# Patient Record
Sex: Female | Born: 1985 | Race: White | Hispanic: No | Marital: Single | Smoking: Current some day smoker
Health system: Southern US, Community
[De-identification: ages and names within clinical notes are randomized; demographics above are authoritative.]

## PROBLEM LIST (undated history)

## (undated) DIAGNOSIS — G43909 Migraine, unspecified, not intractable, without status migrainosus: Secondary | ICD-10-CM

## (undated) DIAGNOSIS — C539 Malignant neoplasm of cervix uteri, unspecified: Secondary | ICD-10-CM

## (undated) HISTORY — PX: TONSILLECTOMY: SUR1361

## (undated) HISTORY — PX: ABDOMINAL HYSTERECTOMY: SHX81

---

## 2006-04-11 ENCOUNTER — Observation Stay (HOSPITAL_COMMUNITY): Admission: AD | Admit: 2006-04-11 | Discharge: 2006-04-12 | Payer: Self-pay | Admitting: Obstetrics & Gynecology

## 2006-05-07 ENCOUNTER — Ambulatory Visit (HOSPITAL_COMMUNITY): Admission: AD | Admit: 2006-05-07 | Discharge: 2006-05-07 | Payer: Self-pay | Admitting: Obstetrics & Gynecology

## 2006-05-28 ENCOUNTER — Ambulatory Visit (HOSPITAL_COMMUNITY): Admission: AD | Admit: 2006-05-28 | Discharge: 2006-05-28 | Payer: Self-pay | Admitting: Obstetrics & Gynecology

## 2019-06-04 DIAGNOSIS — N83292 Other ovarian cyst, left side: Secondary | ICD-10-CM | POA: Insufficient documentation

## 2019-06-04 DIAGNOSIS — N809 Endometriosis, unspecified: Secondary | ICD-10-CM | POA: Insufficient documentation

## 2020-08-13 ENCOUNTER — Other Ambulatory Visit: Payer: Self-pay

## 2020-08-13 ENCOUNTER — Ambulatory Visit
Admission: EM | Admit: 2020-08-13 | Discharge: 2020-08-13 | Disposition: A | Discharge status: Home / Self Care | Payer: Medicaid Other | Attending: Emergency Medicine | Admitting: Emergency Medicine

## 2020-08-13 DIAGNOSIS — Z1152 Encounter for screening for COVID-19: Secondary | ICD-10-CM

## 2020-08-13 DIAGNOSIS — R6889 Other general symptoms and signs: Secondary | ICD-10-CM | POA: Diagnosis not present

## 2020-08-13 DIAGNOSIS — Z20822 Contact with and (suspected) exposure to covid-19: Secondary | ICD-10-CM

## 2020-08-13 HISTORY — DX: Malignant neoplasm of cervix uteri, unspecified: C53.9

## 2020-08-13 MED ORDER — MELOXICAM 7.5 MG PO TABS: 7.5000 mg | ORAL_TABLET | Freq: Every day | ORAL | 0 refills | Status: DC

## 2020-08-13 MED ORDER — ONDANSETRON HCL 4 MG PO TABS: 4.0000 mg | ORAL_TABLET | Freq: Four times a day (QID) | ORAL | 0 refills | Status: DC

## 2020-08-13 NOTE — Discharge Instructions (Signed)
COVID testing ordered.  It will take between 5-7 days for test results.  Someone will contact you regarding abnormal results.    In the meantime: You should remain isolated in your home for 10 days from symptom onset AND greater than 72 hours after symptoms resolution (absence of fever without the use of fever-reducing medication and improvement in respiratory symptoms), whichever is longer Get plenty of rest and push fluids Zofran for nausea and vomiting Use OTC medications like ibuprofen or tylenol as needed fever or pain Call or go to the ED if you have any new or worsening symptoms such as fever, cough, shortness of breath, chest tightness, chest pain, turning blue, changes in mental status, etc..Marland Kitchen

## 2020-08-13 NOTE — ED Triage Notes (Signed)
Pt presents with c/o nausea and vomiting and diarrhea that began yesterday , pt has also had lymph node swelling and pain for past 2 weeks, report nodes in neck, groin and arms are painful

## 2020-08-13 NOTE — ED Provider Notes (Signed)
[place]   [id number] 08/13/20 Arrival Time: 2751   CC: COVID symptoms  SUBJECTIVE: History from: patient.  Jill Fields is a 34 y.o. female who presents with fatigue, LAD x 1 week, nausea, vomiting x 4-5 episodes, and diarrhea x 4-5 episodes, x 1 day.  Denies sick exposure to COVID, flu or strep.  Has NOT tried OTC medications.  Symptoms are made worse with eating and drinking.  Denies previous COVID infection in the past.   Denies fever, chills, sinus pain, rhinorrhea, sore throat, SOB, wheezing, chest pain, changes in bladder habits.   ROS: As per HPI.  All other pertinent ROS negative.     Past Medical History:  Diagnosis Date  . Cervical cancer [facility])    Past Surgical History:  Procedure Laterality Date  . ABDOMINAL HYSTERECTOMY    . TONSILLECTOMY     Allergies  Allergen Reactions  . Ambien [Zolpidem]   . Latex    No current facility-administered medications on file prior to encounter.   No current outpatient medications on file prior to encounter.   Social History   Socioeconomic History  . Marital status: Single    Spouse name: Not on file  . Number of children: Not on file  . Years of education: Not on file  . Highest education level: Not on file  Occupational History  . Not on file  Tobacco Use  . Smoking status: Current Some Day Smoker  . Smokeless tobacco: Never Used  Substance and Sexual Activity  . Alcohol use: Not Currently  . Drug use: Not Currently  . Sexual activity: Not on file  Other Topics Concern  . Not on file  Social History Narrative  . Not on file   Social Determinants of Health   Financial Resource Strain:   . Difficulty of Paying Living Expenses: Not on file  Food Insecurity:   . Worried About Charity fundraiser in the Last Year: Not on file  . Ran Out of Food in the Last Year: Not on file  Transportation Needs:   . Lack of Transportation (Medical): Not on file  . Lack of Transportation (Non-Medical): Not on  file  Physical Activity:   . Days of Exercise per Week: Not on file  . Minutes of Exercise per Session: Not on file  Stress:   . Feeling of Stress : Not on file  Social Connections:   . Frequency of Communication with Friends and Family: Not on file  . Frequency of Social Gatherings with Friends and Family: Not on file  . Attends Religious Services: Not on file  . Active Member of Clubs or Organizations: Not on file  . Attends Archivist Meetings: Not on file  . Marital Status: Not on file  Intimate Partner Violence:   . Fear of Current or Ex-Partner: Not on file  . Emotionally Abused: Not on file  . Physically Abused: Not on file  . Sexually Abused: Not on file   Family History  Family history unknown: Yes    OBJECTIVE:  Vitals:   08/13/20 1258  BP: 98/66  Pulse: 66  Resp: 20  Temp: 97.7 F (36.5 C)  SpO2: 95%     General appearance: alert; appears fatigued, but nontoxic; speaking in full sentences and tolerating own secretions HEENT: NCAT; Ears: EACs clear; Eyes: PERRL.  EOM grossly intact. Sinuses: nontender; Nose: nares patent without rhinorrhea, Throat: oropharynx clear, tonsils non erythematous or enlarged, uvula midline  Lungs: unlabored respirations, symmetrical air  entry; cough: absent; no respiratory distress; CTAB Heart: regular rate and rhythm.   Abdomen: soft, nondistended, normal active bowel sounds; nontender to palpation; no guarding  Skin: warm and dry Psychological: alert and cooperative; normal mood and affect  ASSESSMENT & PLAN:  1. Encounter for screening for COVID-19   2. Flu-like symptoms   3. Suspected COVID-19 virus infection     Meds ordered this encounter  Medications  . ondansetron (ZOFRAN) 4 MG tablet    Sig: Take 1 tablet (4 mg total) by mouth every 6 (six) hours.    Dispense:  12 tablet    Refill:  0    Order Specific Question:   Supervising Provider    Answer:   Raylene Everts [[id number]]   Offered patient further  evaluation and management in the ED due to patient stating she "has not peed all day."  Patient declines at this time and would like to try outpatient therapy first.  Aware of the risk associated with this decision including missed diagnosis, organ damage, organ failure, and/or death.  Patient aware and in agreement.     COVID testing ordered.  It will take between 5-7 days for test results.  Someone will contact you regarding abnormal results.    In the meantime: You should remain isolated in your home for 10 days from symptom onset AND greater than 72 hours after symptoms resolution (absence of fever without the use of fever-reducing medication and improvement in respiratory symptoms), whichever is longer Get plenty of rest and push fluids Zofran for nausea and vomiting Mobic as needed for pain Use OTC medications like ibuprofen or tylenol as needed fever or pain Call or go to the ED if you have any new or worsening symptoms such as fever, cough, shortness of breath, chest tightness, chest pain, turning blue, changes in mental status, etc...   Reviewed expectations re: course of current medical issues. Questions answered. Outlined signs and symptoms indicating need for more acute intervention. Patient verbalized understanding. After Visit Summary given.         Lestine Box, PA-C 08/13/20 1315

## 2020-08-15 LAB — NOVEL CORONAVIRUS, NAA: SARS-CoV-2, NAA: NOT DETECTED

## 2020-08-15 LAB — SARS-COV-2, NAA 2 DAY TAT

## 2020-08-19 ENCOUNTER — Encounter: Payer: Self-pay | Admitting: Emergency Medicine

## 2020-08-19 ENCOUNTER — Ambulatory Visit
Admission: EM | Admit: 2020-08-19 | Discharge: 2020-08-19 | Disposition: A | Discharge status: Home / Self Care | Payer: Medicaid Other | Attending: Emergency Medicine | Admitting: Emergency Medicine

## 2020-08-19 DIAGNOSIS — M5430 Sciatica, unspecified side: Secondary | ICD-10-CM | POA: Diagnosis not present

## 2020-08-19 DIAGNOSIS — M549 Dorsalgia, unspecified: Secondary | ICD-10-CM | POA: Diagnosis not present

## 2020-08-19 MED ORDER — ACETAMINOPHEN 500 MG PO TABS: 500.0000 mg | ORAL_TABLET | Freq: Four times a day (QID) | ORAL | 0 refills | Status: DC | PRN

## 2020-08-19 MED ORDER — DEXAMETHASONE SODIUM PHOSPHATE 10 MG/ML IJ SOLN
10.0000 mg | Freq: Once | INTRAMUSCULAR | Status: AC
  Administered 2020-08-19: 10 mg via INTRAMUSCULAR

## 2020-08-19 MED ORDER — IBUPROFEN 800 MG PO TABS: 800.0000 mg | ORAL_TABLET | Freq: Three times a day (TID) | ORAL | 0 refills | Status: DC

## 2020-08-19 MED ORDER — PREDNISONE 10 MG (21) PO TBPK: ORAL_TABLET | ORAL | 0 refills | Status: DC

## 2020-08-19 MED ORDER — CYCLOBENZAPRINE HCL 5 MG PO TABS: 5.0000 mg | ORAL_TABLET | Freq: Three times a day (TID) | ORAL | 0 refills | Status: DC | PRN

## 2020-08-19 NOTE — Discharge Instructions (Addendum)
Rest, ice and heat as needed Ensure adequate ROM as tolerated. Prescribed Ibuprofen/Tylenol as needed for pain May alternate Tylenol and ibuprofen Prescribed prednisone taper Prescribed flexeril  for muscle spasm.  Do not drive or operate heavy machinery while taking this medication Return here or go to ER if you have any new or worsening symptoms such as numbness/tingling of the inner thighs, loss of bladder or bowel control, headache/blurry vision, nausea/vomiting, confusion/altered mental status, dizziness, weakness, passing out, imbalance, etc.

## 2020-08-19 NOTE — ED Provider Notes (Signed)
[place]   [id number] 08/19/20 Arrival Time: 1428   Chief Complaint  Patient presents with   Back Pain     SUBJECTIVE: History from: patient.  Jill Fields is a 34 y.o. female presented to urgent care for complaint of acute on chronic back pain that has been getting worse for the past 2 days.  Report pain radiating to bilateral legs.  Denies a precipitating event.  He localizes the pain to the bilateral legs.  He describes the pain as constant and achy.  He has tried OTC medications without relief.  His symptoms are made worse with ROM.  He denies similar symptoms in the past.  Denies chills, fever, nausea, vomiting, diarrhea     ROS: As per HPI.  All other pertinent ROS negative.     Past Medical History:  Diagnosis Date   Cervical cancer [facility])    Past Surgical History:  Procedure Laterality Date   ABDOMINAL HYSTERECTOMY     TONSILLECTOMY     Allergies  Allergen Reactions   Ambien [Zolpidem]    Latex    No current facility-administered medications on file prior to encounter.   Current Outpatient Medications on File Prior to Encounter  Medication Sig Dispense Refill   meloxicam (MOBIC) 7.5 MG tablet Take 1 tablet (7.5 mg total) by mouth daily. 20 tablet 0   ondansetron (ZOFRAN) 4 MG tablet Take 1 tablet (4 mg total) by mouth every 6 (six) hours. 12 tablet 0   Social History   Socioeconomic History   Marital status: Single    Spouse name: Not on file   Number of children: Not on file   Years of education: Not on file   Highest education level: Not on file  Occupational History   Not on file  Tobacco Use   Smoking status: Current Some Day Smoker   Smokeless tobacco: Never Used  Substance and Sexual Activity   Alcohol use: Not Currently   Drug use: Not Currently   Sexual activity: Not on file  Other Topics Concern   Not on file  Social History Narrative   Not on file   Social Determinants of Health   Financial Resource  Strain:    Difficulty of Paying Living Expenses: Not on file  Food Insecurity:    Worried About [place] in the Last Year: Not on file   Ran Out of Food in the Last Year: Not on file  Transportation Needs:    Lack of Transportation (Medical): Not on file   Lack of Transportation (Non-Medical): Not on file  Physical Activity:    Days of Exercise per Week: Not on file   Minutes of Exercise per Session: Not on file  Stress:    Feeling of Stress : Not on file  Social Connections:    Frequency of Communication with Friends and Family: Not on file   Frequency of Social Gatherings with Friends and Family: Not on file   Attends Religious Services: Not on file   Active Member of Clubs or Organizations: Not on file   Attends Archivist Meetings: Not on file   Marital Status: Not on file  Intimate Partner Violence:    Fear of Current or Ex-Partner: Not on file   Emotionally Abused: Not on file   Physically Abused: Not on file   Sexually Abused: Not on file   Family History  Family history unknown: Yes    OBJECTIVE:  Vitals:   08/19/20 1434  BP:  104/69  Pulse: 95  Resp: 16  Temp: 97.7 F (36.5 C)  SpO2: 97%     Physical Exam Vitals and nursing note reviewed.  Constitutional:      General: She is not in acute distress.    Appearance: Normal appearance. She is normal weight. She is not ill-appearing, toxic-appearing or diaphoretic.  HENT:     Head: Normocephalic.  Cardiovascular:     Rate and Rhythm: Normal rate and regular rhythm.     Pulses: Normal pulses.     Heart sounds: Normal heart sounds. No murmur heard.  No friction rub. No gallop.   Pulmonary:     Effort: Pulmonary effort is normal. No respiratory distress.     Breath sounds: Normal breath sounds. No stridor. No wheezing, rhonchi or rales.  Chest:     Chest wall: No tenderness.  Musculoskeletal:     Lumbar back: Spasms and tenderness present.     Comments: Back:    Patient ambulates from chair to exam table without difficulty.  Inspection: Skin clear and intact without obvious swelling, erythema, or ecchymosis. Warm to the touch  Palpation: Vertebral processes nontender. Tenderness about the lower paravertebral muscles  ROM: FROM Strength: 5/5 hip flexion, 5/5 knee extension, 5/5 knee flexion, 5/5 plantar flexion, 5/5 dorsiflexion  DTR: Patellar tendon reflex intact    Neurological:     Mental Status: She is alert and oriented to person, place, and time.     LABS:  No results found for this or any previous visit (from the past 24 hour(s)).   ASSESSMENT & PLAN:  1. Back pain with sciatica     Meds ordered this encounter  Medications   cyclobenzaprine (FLEXERIL) 5 MG tablet    Sig: Take 1 tablet (5 mg total) by mouth 3 (three) times daily as needed.    Dispense:  30 tablet    Refill:  0   acetaminophen (TYLENOL) 500 MG tablet    Sig: Take 1 tablet (500 mg total) by mouth every 6 (six) hours as needed.    Dispense:  30 tablet    Refill:  0   ibuprofen (ADVIL) 800 MG tablet    Sig: Take 1 tablet (800 mg total) by mouth 3 (three) times daily.    Dispense:  30 tablet    Refill:  0   predniSONE (STERAPRED UNI-PAK 21 TAB) 10 MG (21) TBPK tablet    Sig: Take 6 tabs by mouth daily  for 1 days, then 5 tabs for 1 days, then 4 tabs for 1 days, then 3 tabs for 1 days, 2 tabs for 1 days, then 1 tab by mouth daily for 1 days    Dispense:  21 tablet    Refill:  0   dexamethasone (DECADRON) injection 10 mg    Rest, ice and heat as needed Ensure adequate ROM as tolerated. Prescribed Ibuprofen/Tylenol as needed for pain Prescribed prednisone taper Prescribed flexeril  for muscle spasm.  Do not drive or operate heavy machinery while taking this medication Return here or go to ER if you have any new or worsening symptoms such as numbness/tingling of the inner thighs, loss of bladder or bowel control, headache/blurry vision, nausea/vomiting,  confusion/altered mental status, dizziness, weakness, passing out, imbalance, etc...     Reviewed expectations re: course of current medical issues. Questions answered. Outlined signs and symptoms indicating need for more acute intervention. Patient verbalized understanding. After Visit Summary given.         Pilar Grammes  S, FNP 08/19/20 1523

## 2020-08-19 NOTE — ED Triage Notes (Signed)
Patient states that shes had chronic back pain for years, now having bad lower back pain that runs down both legs x 2 days- sciatic pain

## 2020-08-30 ENCOUNTER — Other Ambulatory Visit: Payer: Self-pay

## 2020-08-30 ENCOUNTER — Ambulatory Visit
Admission: EM | Admit: 2020-08-30 | Discharge: 2020-08-30 | Disposition: A | Discharge status: Home / Self Care | Payer: Medicaid Other | Attending: Emergency Medicine | Admitting: Emergency Medicine

## 2020-08-30 ENCOUNTER — Encounter: Payer: Self-pay | Admitting: Emergency Medicine

## 2020-08-30 DIAGNOSIS — J069 Acute upper respiratory infection, unspecified: Secondary | ICD-10-CM

## 2020-08-30 DIAGNOSIS — Z1152 Encounter for screening for COVID-19: Secondary | ICD-10-CM | POA: Diagnosis not present

## 2020-08-30 MED ORDER — CETIRIZINE HCL 10 MG PO TABS: 10.0000 mg | ORAL_TABLET | Freq: Every day | ORAL | 0 refills | Status: DC

## 2020-08-30 MED ORDER — DEXAMETHASONE 4 MG PO TABS: 4.0000 mg | ORAL_TABLET | Freq: Every day | ORAL | 0 refills | Status: AC

## 2020-08-30 MED ORDER — FLUTICASONE PROPIONATE 50 MCG/ACT NA SUSP: 1.0000 | Freq: Every day | NASAL | 0 refills | Status: DC

## 2020-08-30 MED ORDER — BENZONATATE 100 MG PO CAPS: 100.0000 mg | ORAL_CAPSULE | Freq: Three times a day (TID) | ORAL | 0 refills | Status: DC

## 2020-08-30 NOTE — Discharge Instructions (Addendum)

## 2020-08-30 NOTE — ED Provider Notes (Signed)
[place]   [id number] 08/30/20 Arrival Time: 1330   CC: COVID symptoms  SUBJECTIVE: History from: patient.mother  Fey Coghill is a 34 y.o. female resented to the urgent care for complaint of chills, fever, congestion cough, headache for the past few days.  Reported positive Covid exposure.  Denies sick exposure to flu or strep.  Denies recent travel.  Has tried OTC medication without relief.  Denies aggravating factors.  Denies previous symptoms in the past.   Denies  fatigue, sinus pain, rhinorrhea, sore throat, SOB, wheezing, chest pain, nausea, changes in bowel or bladder habits.     ROS: As per HPI.  All other pertinent ROS negative.     Past Medical History:  Diagnosis Date  . Cervical cancer [facility])    Past Surgical History:  Procedure Laterality Date  . ABDOMINAL HYSTERECTOMY    . TONSILLECTOMY     Allergies  Allergen Reactions  . Ambien [Zolpidem]   . Latex    No current facility-administered medications on file prior to encounter.   Current Outpatient Medications on File Prior to Encounter  Medication Sig Dispense Refill  . acetaminophen (TYLENOL) 500 MG tablet Take 1 tablet (500 mg total) by mouth every 6 (six) hours as needed. 30 tablet 0  . cyclobenzaprine (FLEXERIL) 5 MG tablet Take 1 tablet (5 mg total) by mouth 3 (three) times daily as needed. 30 tablet 0  . ibuprofen (ADVIL) 800 MG tablet Take 1 tablet (800 mg total) by mouth 3 (three) times daily. 30 tablet 0  . meloxicam (MOBIC) 7.5 MG tablet Take 1 tablet (7.5 mg total) by mouth daily. 20 tablet 0  . ondansetron (ZOFRAN) 4 MG tablet Take 1 tablet (4 mg total) by mouth every 6 (six) hours. 12 tablet 0  . predniSONE (STERAPRED UNI-PAK 21 TAB) 10 MG (21) TBPK tablet Take 6 tabs by mouth daily  for 1 days, then 5 tabs for 1 days, then 4 tabs for 1 days, then 3 tabs for 1 days, 2 tabs for 1 days, then 1 tab by mouth daily for 1 days 21 tablet 0   Social History   Socioeconomic History  .  Marital status: Single    Spouse name: Not on file  . Number of children: Not on file  . Years of education: Not on file  . Highest education level: Not on file  Occupational History  . Not on file  Tobacco Use  . Smoking status: Current Some Day Smoker  . Smokeless tobacco: Never Used  Substance and Sexual Activity  . Alcohol use: Not Currently  . Drug use: Not Currently  . Sexual activity: Not on file  Other Topics Concern  . Not on file  Social History Narrative  . Not on file   Social Determinants of Health   Financial Resource Strain:   . Difficulty of Paying Living Expenses: Not on file  Food Insecurity:   . Worried About Charity fundraiser in the Last Year: Not on file  . Ran Out of Food in the Last Year: Not on file  Transportation Needs:   . Lack of Transportation (Medical): Not on file  . Lack of Transportation (Non-Medical): Not on file  Physical Activity:   . Days of Exercise per Week: Not on file  . Minutes of Exercise per Session: Not on file  Stress:   . Feeling of Stress : Not on file  Social Connections:   . Frequency of Communication with Friends and Family: Not  on file  . Frequency of Social Gatherings with Friends and Family: Not on file  . Attends Religious Services: Not on file  . Active Member of Clubs or Organizations: Not on file  . Attends Archivist Meetings: Not on file  . Marital Status: Not on file  Intimate Partner Violence:   . Fear of Current or Ex-Partner: Not on file  . Emotionally Abused: Not on file  . Physically Abused: Not on file  . Sexually Abused: Not on file   Family History  Family history unknown: Yes    OBJECTIVE:  Vitals:   08/30/20 1402 08/30/20 1411  BP:  110/76  Pulse:  (!) 105  Resp:  19  Temp:  98.6 F (37 C)  TempSrc:  Oral  SpO2:  95%  Weight: 116 lb (52.6 kg)   Height: 5\' 1"  (1.549 m)      General appearance: alert; appears fatigued, but nontoxic; speaking in full sentences and  tolerating own secretions HEENT: NCAT; Ears: EACs clear, TMs pearly gray; Eyes: PERRL.  EOM grossly intact. Sinuses: nontender; Nose: nares patent without rhinorrhea, Throat: oropharynx clear, tonsils non erythematous or enlarged, uvula midline  Neck: supple without LAD Lungs: unlabored respirations, symmetrical air entry; cough: moderate; no respiratory distress; CTAB Heart: regular rate and rhythm.  Radial pulses 2+ symmetrical bilaterally Skin: warm and dry Psychological: alert and cooperative; normal mood and affect  LABS:  No results found for this or any previous visit (from the past 24 hour(s)).   ASSESSMENT & PLAN:  1. URI with cough and congestion   2. Encounter for screening for COVID-19     Meds ordered this encounter  Medications  . fluticasone (FLONASE) 50 MCG/ACT nasal spray    Sig: Place 1 spray into both nostrils daily for 14 days.    Dispense:  16 g    Refill:  0  . cetirizine (ZYRTEC ALLERGY) 10 MG tablet    Sig: Take 1 tablet (10 mg total) by mouth daily.    Dispense:  30 tablet    Refill:  0  . dexamethasone (DECADRON) 4 MG tablet    Sig: Take 1 tablet (4 mg total) by mouth daily for 7 days.    Dispense:  7 tablet    Refill:  0  . benzonatate (TESSALON) 100 MG capsule    Sig: Take 1 capsule (100 mg total) by mouth every 8 (eight) hours.    Dispense:  30 capsule    Refill:  0        COVID testing ordered.  It will take between 2-7 days for test results.  Someone will contact you regarding abnormal results.    In the meantime: You should remain isolated in your home for 10 days from symptom onset AND greater than 24 hours after symptoms resolution (absence of fever without the use of fever-reducing medication and improvement in respiratory symptoms), whichever is longer Get plenty of rest and push fluids Tessalon Perles prescribed for cough Zyrtec for nasal congestion, runny nose, and/or sore throat Flonase for nasal congestion and runny nose Decadron  was prescribed Use medications daily for symptom relief Use OTC medications like ibuprofen or tylenol as needed fever or pain Call or go to the ED if you have any new or worsening symptoms such as fever, worsening cough, shortness of breath, chest tightness, chest pain, turning blue, changes in mental status, etc...   Reviewed expectations re: course of current medical issues. Questions answered. Outlined signs and symptoms  indicating need for more acute intervention. Patient verbalized understanding. After Visit Summary given.         Emerson Monte, FNP 08/30/20 1501

## 2020-08-30 NOTE — ED Triage Notes (Signed)
Hot flashes, cough, tired, achy and headache x a few days. Pt has been exposed to covid + person.

## 2020-08-31 LAB — SARS-COV-2, NAA 2 DAY TAT

## 2020-08-31 LAB — NOVEL CORONAVIRUS, NAA: SARS-CoV-2, NAA: NOT DETECTED

## 2020-11-03 ENCOUNTER — Encounter (INDEPENDENT_AMBULATORY_CARE_PROVIDER_SITE_OTHER): Payer: Self-pay | Admitting: *Deleted

## 2021-04-06 ENCOUNTER — Encounter: Payer: Self-pay | Admitting: Emergency Medicine

## 2021-04-06 ENCOUNTER — Ambulatory Visit
Admission: EM | Admit: 2021-04-06 | Discharge: 2021-04-06 | Disposition: A | Discharge status: Home / Self Care | Payer: Medicaid Other | Attending: Emergency Medicine | Admitting: Emergency Medicine

## 2021-04-06 ENCOUNTER — Other Ambulatory Visit: Payer: Self-pay

## 2021-04-06 DIAGNOSIS — R0981 Nasal congestion: Secondary | ICD-10-CM

## 2021-04-06 DIAGNOSIS — Z1152 Encounter for screening for COVID-19: Secondary | ICD-10-CM

## 2021-04-06 DIAGNOSIS — R112 Nausea with vomiting, unspecified: Secondary | ICD-10-CM

## 2021-04-06 DIAGNOSIS — J209 Acute bronchitis, unspecified: Secondary | ICD-10-CM

## 2021-04-06 DIAGNOSIS — R059 Cough, unspecified: Secondary | ICD-10-CM

## 2021-04-06 MED ORDER — METOCLOPRAMIDE HCL 10 MG PO TABS: 10.0000 mg | ORAL_TABLET | Freq: Three times a day (TID) | ORAL | 0 refills | Status: DC | PRN

## 2021-04-06 MED ORDER — ONDANSETRON HCL 4 MG/2ML IJ SOLN
4.0000 mg | Freq: Once | INTRAMUSCULAR | Status: AC
  Administered 2021-04-06: 4 mg via INTRAMUSCULAR

## 2021-04-06 MED ORDER — BENZONATATE 100 MG PO CAPS: 100.0000 mg | ORAL_CAPSULE | Freq: Three times a day (TID) | ORAL | 0 refills | Status: DC

## 2021-04-06 MED ORDER — PREDNISONE 20 MG PO TABS: 20.0000 mg | ORAL_TABLET | Freq: Two times a day (BID) | ORAL | 0 refills | Status: AC

## 2021-04-06 NOTE — ED Provider Notes (Addendum)
[place]   [id number] 04/06/21 Arrival Time: 1212  Cc: COUGH  SUBJECTIVE:  Jill Fields is a 35 y.o. female who presents with congestion, cough, wheezing, and nausea x 3 days.  Denies sick exposure or precipitating event.   Has tried OTC medications without relief.  Denies aggravating factors.  Reports previous symptoms in the past.   Denies fever, SOB, chest pain, nausea, changes in bowel or bladder habits.    ROS: As per HPI.  All other pertinent ROS negative.     Past Medical History:  Diagnosis Date  . Cervical cancer [facility])    Past Surgical History:  Procedure Laterality Date  . ABDOMINAL HYSTERECTOMY    . TONSILLECTOMY     Allergies  Allergen Reactions  . Ambien [Zolpidem]   . Latex    No current facility-administered medications on file prior to encounter.   Current Outpatient Medications on File Prior to Encounter  Medication Sig Dispense Refill  . [DISCONTINUED] cetirizine (ZYRTEC ALLERGY) 10 MG tablet Take 1 tablet (10 mg total) by mouth daily. 30 tablet 0  . [DISCONTINUED] fluticasone (FLONASE) 50 MCG/ACT nasal spray Place 1 spray into both nostrils daily for 14 days. 16 g 0    Social History   Socioeconomic History  . Marital status: Single    Spouse name: Not on file  . Number of children: Not on file  . Years of education: Not on file  . Highest education level: Not on file  Occupational History  . Not on file  Tobacco Use  . Smoking status: Current Some Day Smoker  . Smokeless tobacco: Never Used  Substance and Sexual Activity  . Alcohol use: Not Currently  . Drug use: Not Currently  . Sexual activity: Not on file  Other Topics Concern  . Not on file  Social History Narrative  . Not on file   Social Determinants of Health   Financial Resource Strain: Not on file  Food Insecurity: Not on file  Transportation Needs: Not on file  Physical Activity: Not on file  Stress: Not on file  Social Connections: Not on file  Intimate  Partner Violence: Not on file   Family History  Family history unknown: Yes     OBJECTIVE:  Vitals:   04/06/21 1236  BP: 109/64  Pulse: (!) 107  Resp: 19  Temp: 98.1 F (36.7 C)  TempSrc: Oral  SpO2: 98%     General appearance: Alert, appears fatigued, but nontoxic; speaking in full sentences without difficulty HEENT:NCAT; Ears: EACs clear, TMs pearly gray; Eyes: PERRL.  EOM grossly intact. Nose: nares patent without rhinorrhea; Throat: tonsils nonerythematous or enlarged, uvula midline  Neck: supple without LAD Lungs: subtle expiratory wheezes; normal respiratory effort; mild cough present Heart: regular rate and rhythm.   Skin: warm and dry Psychological: alert and cooperative; normal mood and affect   ASSESSMENT & PLAN:  1. Encounter for screening for COVID-19   2. Cough   3. Acute bronchitis, unspecified organism   4. Nasal congestion   5. Non-intractable vomiting with nausea, unspecified vomiting type     Meds ordered this encounter  Medications  . predniSONE (DELTASONE) 20 MG tablet    Sig: Take 1 tablet (20 mg total) by mouth 2 (two) times daily with a meal for 5 days.    Dispense:  10 tablet    Refill:  0    Order Specific Question:   Supervising Provider    Answer:   Raylene Everts [[id number]]  .  benzonatate (TESSALON) 100 MG capsule    Sig: Take 1 capsule (100 mg total) by mouth every 8 (eight) hours.    Dispense:  21 capsule    Refill:  0    Order Specific Question:   Supervising Provider    Answer:   Raylene Everts [[id number]]  . metoCLOPramide (REGLAN) 10 MG tablet    Sig: Take 1 tablet (10 mg total) by mouth every 8 (eight) hours as needed for nausea or vomiting.    Dispense:  30 tablet    Refill:  0    Order Specific Question:   Supervising Provider    Answer:   Raylene Everts [[id number]]  . ondansetron (ZOFRAN) injection 4 mg    Orders Placed This Encounter  Procedures  . Covid-19, Flu A+B ([facility])    Standing Status:   Standing     Number of Occurrences:   1     Get plenty of rest and push fluids Prescribed tessolone perles as needed for cough Prednisone for bronchitis IM zofran given in office.  Reglan sent into pharamcy We will NOT do phenergan or promethazine for nausea at this time due to abuse potential Use OTC medication as needed for symptomatic relief Follow up with PCP for recheck and/or if symptoms persists Return or go to ER if you have any new or worsening symptoms such as fever, chills, fatigue, shortness of breath, wheezing, chest pain, nausea, changes in bowel or bladder habits, etc...  Reviewed expectations re: course of current medical issues. Questions answered. Outlined signs and symptoms indicating need for more acute intervention. Patient verbalized understanding. After Visit Summary given.          Lestine Box, PA-C 04/06/21 [place], [place], PA-C 04/06/21 1330

## 2021-04-06 NOTE — ED Triage Notes (Signed)
Congestion x 3 days. Wants refill on albuterol inhaler

## 2021-04-06 NOTE — Discharge Instructions (Addendum)
Get plenty of rest and push fluids Prescribed tessolone perles as needed for cough Prednisone for bronchitis IM zofran given in office.  Reglan sent into pharamcy We will NOT do phenergan or promethazine for nausea at this time due to abuse potential Use OTC medication as needed for symptomatic relief Follow up with PCP for recheck and/or if symptoms persists Return or go to ER if you have any new or worsening symptoms such as fever, chills, fatigue, shortness of breath, wheezing, chest pain, nausea, changes in bowel or bladder habits, etc..Marland Kitchen

## 2021-04-08 LAB — COVID-19, FLU A+B NAA
Influenza A, NAA: NOT DETECTED
Influenza B, NAA: NOT DETECTED
SARS-CoV-2, NAA: DETECTED — AB

## 2021-04-20 ENCOUNTER — Telehealth (INDEPENDENT_AMBULATORY_CARE_PROVIDER_SITE_OTHER): Payer: Self-pay | Admitting: *Deleted

## 2021-04-20 ENCOUNTER — Telehealth: Payer: Self-pay | Admitting: Emergency Medicine

## 2021-04-20 MED ORDER — ALBUTEROL SULFATE HFA 108 (90 BASE) MCG/ACT IN AERS: 1.0000 | INHALATION_SPRAY | Freq: Four times a day (QID) | RESPIRATORY_TRACT | 0 refills | Status: DC | PRN

## 2021-04-20 NOTE — Telephone Encounter (Signed)
Inhaler sent to pharmacy on file.

## 2021-04-20 NOTE — Telephone Encounter (Signed)
On 03/02/21 & 03/16/21 I LM for patient to call and schedule TCS - as of 04/20/21 no return call from patient  Insurance: [place] [id number]  Referring MD/PCP: Venancio Poisson  Procedure: tcs w/ propofol  Reason/Indication:  Screening, fam hx colon ca  Has patient had this procedure before?  no  If so, when, by whom and where?    Is there a family history of colon cancer?  Yes, grandfather  Who?  What age when diagnosed?    Is patient diabetic? If yes, Type 1 or Type 2   no      Does patient have prosthetic heart valve or mechanical valve?  no  Do you have a pacemaker/defibrillator?  no  Has patient ever had endocarditis/atrial fibrillation? no  Have you had a stroke/heart attack last 6 mths? no  Does patient use oxygen? no  Has patient had joint replacement within last 12 months?  on  Is patient constipated or do they take laxatives? no  Does patient have a history of alcohol/drug use?  no  Is patient on blood thinner such as Coumadin, Plavix and/or Aspirin? no  Do you take medicine for weight loss?  no  For female patients,: do you still have your menstrual cycle?   Medications: abilify 5 mg daily, lamotrigine 25 mg bid, topamax 25 mg bid, dextroamphetamine 30 mg bid, clonazepam 1 mg bid, vit d3 once a week, linzess 145 mcg daily, excedrin prn, desvenlafaxine 25 mg bid, fluphenazine 1 mg daily  Allergies: ambien, latex  Medication Adjustment per Dr Rehman/Dr Jenetta Downer   Procedure date & time:

## 2021-04-25 ENCOUNTER — Encounter: Payer: Self-pay | Admitting: Physician Assistant

## 2021-04-25 ENCOUNTER — Telehealth: Payer: Medicaid Other | Admitting: Physician Assistant

## 2021-04-25 DIAGNOSIS — Z8616 Personal history of COVID-19: Secondary | ICD-10-CM

## 2021-04-25 DIAGNOSIS — N939 Abnormal uterine and vaginal bleeding, unspecified: Secondary | ICD-10-CM | POA: Diagnosis not present

## 2021-04-25 DIAGNOSIS — J019 Acute sinusitis, unspecified: Secondary | ICD-10-CM

## 2021-04-25 DIAGNOSIS — K921 Melena: Secondary | ICD-10-CM

## 2021-04-25 DIAGNOSIS — Z90710 Acquired absence of both cervix and uterus: Secondary | ICD-10-CM | POA: Diagnosis not present

## 2021-04-25 DIAGNOSIS — B9689 Other specified bacterial agents as the cause of diseases classified elsewhere: Secondary | ICD-10-CM

## 2021-04-25 DIAGNOSIS — M545 Low back pain, unspecified: Secondary | ICD-10-CM

## 2021-04-25 MED ORDER — BENZONATATE 100 MG PO CAPS: 100.0000 mg | ORAL_CAPSULE | Freq: Three times a day (TID) | ORAL | 0 refills | Status: DC

## 2021-04-25 MED ORDER — DOXYCYCLINE HYCLATE 100 MG PO TABS: 100.0000 mg | ORAL_TABLET | Freq: Two times a day (BID) | ORAL | 0 refills | Status: DC

## 2021-04-25 NOTE — Progress Notes (Signed)
[name], Jill Fields are scheduled for a virtual visit with your provider today.    Just as we do with appointments in the office, we must obtain your consent to participate.  Your consent will be active for this visit and any virtual visit you may have with one of our providers in the next 365 days.    If you have a MyChart account, I can also send a copy of this consent to you electronically.  All virtual visits are billed to your insurance company just like a traditional visit in the office.  As this is a virtual visit, video technology does not allow for your provider to perform a traditional examination.  This may limit your provider's ability to fully assess your condition.  If your provider identifies any concerns that need to be evaluated in person or the need to arrange testing such as labs, EKG, etc, we will make arrangements to do so.    Although advances in technology are sophisticated, we cannot ensure that it will always work on either your end or our end.  If the connection with a video visit is poor, we may have to switch to a telephone visit.  With either a video or telephone visit, we are not always able to ensure that we have a secure connection.   I need to obtain your verbal consent now.   Are you willing to proceed with your visit today?   [name] has provided verbal consent on 04/25/2021 for a virtual visit (video or telephone).  [name], PA-C 04/25/2021  2:06 PM  Virtual Visit via Video   I connected with patient on 04/25/21 at  2:15 PM EDT by a video enabled telemedicine application and verified that I am speaking with the correct person using two identifiers.  Location patient: Home Location provider: [place] participating in the virtual visit: Patient, Provider  I discussed the limitations of evaluation and management by telemedicine and the availability of in person appointments. The patient expressed understanding and agreed  to proceed.  Subjective:   HPI:  Patient presents via [place] today to discuss multiple ongoing issues, and new issues.  Patient was recently seen at [facility] in [place] on 04/06/2021 complaining of 3 days of URI symptoms with wheezing, nausea and dizziness.  Was evaluated and tested positive for COVID.  Was started on a prednisone burst, cough medication and Reglan for nausea.  Patient endorses taking all medications as directed and tolerating well.  Is still having some lingering cough but mainly having substantial sinus pressure now with sinus pain and left ear pain.  Denies chest pain or overt shortness of breath with this.  Is using her albuterol inhaler as needed with some improvement.  States her nebulizer machine is broken and she has not been able to get a new one.  More recently, patient endorses back pain starting last week.  Denies trauma or injury.  Pain is in the right lower back radiating around her hip.  Worse with movement.  As such she has been resting.  Notes some very mild urinary urgency but this has been a more ongoing thing.  Denies dysuria or true hematuria.  Denies any history of nephrolithiasis.  Also noting constipation which she says is chronic for her.  Notes she averages about 3 bowel movements per month.  Is also noting some very melanic stool.  As of the past 24 hours she is also having vaginal bleeding.  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  States she has not had follow-up in some time and is concerned about the possibility of recurrence and colon cancer.  States she always feels thirsty and dehydrated.  Endorses headache but denies any residual dizziness.  Supposed to have colonoscopy but having a hard time getting schedule. Had a PCP but she left the practice.    ROS:   See pertinent positives and negatives per HPI.  There are no problems to display for this  patient.   Social History   Tobacco Use  . Smoking status: Current Some Day Smoker  . Smokeless tobacco: Never Used  Substance Use Topics  . Alcohol use: Not Currently    Current Outpatient Medications:  .  albuterol (VENTOLIN HFA) 108 (90 Base) MCG/ACT inhaler, Inhale 1-2 puffs into the lungs every 6 (six) hours as needed for wheezing or shortness of breath., Disp: 18 g, Rfl: 0 .  benzonatate (TESSALON) 100 MG capsule, Take 1 capsule (100 mg total) by mouth every 8 (eight) hours., Disp: 21 capsule, Rfl: 0 .  metoCLOPramide (REGLAN) 10 MG tablet, Take 1 tablet (10 mg total) by mouth every 8 (eight) hours as needed for nausea or vomiting., Disp: 30 tablet, Rfl: 0  Allergies  Allergen Reactions  . Ambien [Zolpidem]   . Latex     Objective:   There were no vitals taken for this visit.  Patient is well-developed, well-nourished in no acute distress.  Resting comfortably walking outside at home.  Head is normocephalic, atraumatic.  No labored breathing.  Speech is clear and coherent with logical content.  Patient is alert and oriented at baseline.   Assessment and Plan:   1. History of COVID-19 2. Acute bacterial sinusitis Patient diagnosed with COVID-19 on 04/06/2021.  Is having symptoms concerning for secondary bacterial sinusitis.  Supportive measures and OTC medications reviewed.  Discussed with her giving her other symptoms (noted below) she needs in-person evaluation which should include BMP to assess kidney function as she mentions history of kidney issue -- not noted in chart, nor can she give me further details about this. Patient adamant on needing a medication until she can get in to an urgent care. States she can have a friend pick up on their way home from work, but that would need another friend to take her in for in-person evaluation which may be tomorrow at the earliest.   3. S/P hysterectomy 4. Vaginal bleeding, abnormal Discussed with her that this is very  concerning and although there can be plenty of benign causes, giving hx of cancer and hysterectomy due to such, she needs examination and further workup to determine what is going on. She has been advised to seek care ASAP at Urgent Care/ER setting if unable to get in with her PCP/GYN/Oncologist as she endorses. She states she cannot go anywhere today as she does not have a car and her insurance will not cover Urgent Care visits. Discussed again this is something very serious and needs evaluation within next 48-72 hours at most giving her history. She notes she will try to get a friend to take her when they are able.   5. Blood in stool History of constipation. Supposed to be getting scheduled for a colonoscopy but has had no luck.  Again encouraged her to have an in person evaluation so she can get an examination to determine if there is concern from something more  serious as cause of symptoms, or if this is most likely related to constipation.  6. Acute right-sided low back pain without sciatica Unclear etiology.  Getting significant constipation blood in stool, along with vaginal symptoms, this needs to be evaluated in person.  Discussed need for urgent evaluation given constellation of symptoms.  Again patient states she will try to have someone take her to be evaluated.    [name], PA-C 04/25/2021

## 2021-04-27 ENCOUNTER — Encounter: Payer: Self-pay | Admitting: Physician Assistant

## 2021-04-27 ENCOUNTER — Other Ambulatory Visit: Payer: Self-pay | Admitting: Physician Assistant

## 2021-04-27 MED ORDER — PROMETHAZINE-DM 6.25-15 MG/5ML PO SYRP: 5.0000 mL | ORAL_SOLUTION | Freq: Four times a day (QID) | ORAL | 0 refills | Status: DC | PRN

## 2021-05-03 ENCOUNTER — Telehealth: Payer: Medicaid Other | Admitting: Physician Assistant

## 2021-05-03 ENCOUNTER — Telehealth: Payer: Medicaid Other | Admitting: Nurse Practitioner

## 2021-05-03 ENCOUNTER — Encounter: Payer: Self-pay | Admitting: Physician Assistant

## 2021-05-03 DIAGNOSIS — G44319 Acute post-traumatic headache, not intractable: Secondary | ICD-10-CM

## 2021-05-03 DIAGNOSIS — S060X0S Concussion without loss of consciousness, sequela: Secondary | ICD-10-CM

## 2021-05-03 DIAGNOSIS — S0101XS Laceration without foreign body of scalp, sequela: Secondary | ICD-10-CM

## 2021-05-03 DIAGNOSIS — S0990XA Unspecified injury of head, initial encounter: Secondary | ICD-10-CM

## 2021-05-03 NOTE — Patient Instructions (Signed)
[facility] (pp. [phone]). [place], [place]. Elsevier."> Neurosurgery, 80(1), 6-15. Retrieved on May 17, 2019.[email or web address]"> Primary Care (5th ed., pp. 218-221). [place], [place]: Elsevier."> Rosen's Emergency Medicine: Concepts and Clinical Practice (9th ed., pp. 301-329). [place], [place]: Elsevier."> Neurosurgery, 75 Suppl 1, S3-15. Retrieved on May 17, 2019.[email or web address]/[email or web address]">  Head Injury, Adult There are many types of head injuries. Head injuries can be as minor as a small bump, or they can be a serious medical issue. More severe head injuries include:  A jarring injury to the brain (concussion).  A bruise (contusion) of the brain. This means there is bleeding in the brain that can cause swelling.  A cracked skull (skull fracture).  Bleeding in the brain that collects, clots, and forms a bump (hematoma). After a head injury, most problems occur within the first 24 hours, but side effects may occur up to 7-10 days after the injury. It is important to watch your condition for any changes. You may need to be observed in the emergency department or urgent care, or you may be admitted to the hospital. What are the causes? There are many possible causes of a head injury. Serious head injuries may be caused by car accidents, bicycle or motorcycle accidents, sports injuries, falls, or being struck by an object. What are the symptoms? Symptoms of a head injury include a contusion, bump, or bleeding at the site of the injury. Other physical symptoms may include:  Headache.  Nausea or vomiting.  Dizziness.  Blurred or double vision.  Being uncomfortable around bright lights or loud noises.  Seizures.  Feeling tired.  Trouble being awakened.  Loss of consciousness. Mental or emotional symptoms may include:  Irritability.  Confusion and memory problems.  Poor attention and concentration.  Changes  in eating or sleeping habits.  Anxiety or depression. How is this diagnosed? This condition can usually be diagnosed based on your symptoms, a description of the injury, and a physical exam. You may also have imaging tests done, such as a CT scan or an MRI. How is this treated? Treatment for this condition depends on the severity and type of injury you have. The main goal of treatment is to prevent complications and allow the brain time to heal. Mild head injury If you have a mild head injury, you may be sent home, and treatment may include:  Observation. A responsible adult should stay with you for 24 hours after your injury and check on you often.  Physical rest.  Brain rest.  Pain medicines. Severe head injury If you have a severe head injury, treatment may include:  Close observation. This includes hospitalization with the following care: ? Frequent physical exams. ? Frequent checks of how your brain and nervous system are working (neurological status). ? Checking your blood pressure and oxygen levels.  Medicines to relieve pain, prevent seizures, and decrease brain swelling.  Airway protection and breathing support. This may include using a ventilator.  Treatments that monitor and manage swelling inside the brain.  Brain surgery. This may be needed to: ? Remove a collection of blood or blood clots. ? Stop the bleeding. ? Remove a part of the skull to allow room for the brain to swell. Follow these instructions at home: Activity  Rest and avoid activities that are physically hard or tiring.  Make sure you get enough sleep.  Let your brain rest by limiting activities that require a lot of thought or attention, such as: ? Watching TV. ? Playing memory games  and puzzles. ? Job-related work or homework. ? Working on [employer], and texting.  Avoid activities that could cause another head injury, such as playing sports, until your health care  provider approves. Having another head injury, especially before the first one has healed, can be dangerous.  Ask your health care provider when it is safe for you to return to your regular activities, including work or school. Ask your health care provider for a step-by-step plan for gradually returning to activities.  Ask your health care provider when you can drive, ride a bicycle, or use heavy machinery. Your ability to react may be slower after a brain injury. Do not do these activities if you are dizzy. Lifestyle  Do not drink alcohol until your health care provider approves. Do not use drugs. Alcohol and certain drugs may slow your recovery and can put you at risk of further injury.  If it is harder than usual to remember things, write them down.  If you are easily distracted, try to do one thing at a time.  Talk with family members or close friends when making important decisions.  Tell your friends, family, a trusted colleague, and work Freight forward[employer] about your injury, symptoms, and restrictions. Have them watch for any new or worsening problems.   General instructions  Take over-the-counter and prescription medicines only as told by your health care provider.  Have someone stay with you for 24 hours after your head injury. This person should watch you for any changes in your symptoms and be ready to seek medical help.  Keep all follow-up visits as told by your health care provider. This is important. How is this prevented?  Work on improving your balance and strength to avoid falls.  Wear a seat belt when you are in a moving vehicle.  Wear a helmet when riding a bicycle, skiing, or doing any other sport or activity that has a risk of injury.  If you drink alcohol: ? Limit how much you use to:  0-1 drink a day for nonpregnant women.  0-2 drinks a day for men. ? Be aware of how much alcohol is in your drink. In the [place] one drink equals one 12 oz bottle of beer (355 mL), one 5  oz glass of wine (148 mL), or one 1 oz glass of hard liquor (44 mL).  Take safety measures in your home, such as: ? Removing clutter and tripping hazards from floors and stairways. ? Using grab bars in bathrooms and handrails by stairs. ? Placing non-slip mats on floors and in bathtubs. ? Improving lighting in dim areas. Where to find more information  Centers for Disease Control and Prevention: http://www.wolf.info/ Get help right away if:  You have: ? A severe headache that is not helped by medicine. ? Trouble walking or weakness in your arms and legs. ? Clear or bloody fluid coming from your nose or ears. ? Changes in your vision. ? A seizure. ? Increased confusion or irritability.  Your symptoms get worse.  You are sleepier than normal and have trouble staying awake.  You lose your balance.  Your pupils change size.  Your speech is slurred.  Your dizziness gets worse.  You vomit. These symptoms may represent a serious problem that is an emergency. Do not wait to see if the symptoms will go away. Get medical help right away. Call your local emergency services (911 in the [place] Do not drive yourself to the hospital. Summary  Head  injuries can be minor, or they can be a serious medical issue requiring immediate attention.  Treatment for this condition depends on the severity and type of injury you have.  Have someone stay with you for 24 hours after your injury and check on you often.  Ask your health care provider when it is safe for you to return to your regular activities, including work or school.  Head injury prevention includes wearing a seat belt in a motor vehicle, using a helmet on a bicycle, limiting alcohol use, and taking safety measures in your home. This information is not intended to replace advice given to you by your health care provider. Make sure you discuss any questions you have with your health care provider. Document Revised: 09/25/2019 Document Reviewed:  09/25/2019 Elsevier Patient Education  2021 [employer].

## 2021-05-03 NOTE — Progress Notes (Signed)
Based on what you shared with me it looks like you have pain form concussion and scalp laceration,that should be evaluated in a face to face office visit. We cannot do controlled medication in an esist and if you are hurting to the point that ultram is not sufficient to control your pain, then you need to be reevaluated by the ED.   NOTE: If you entered your credit card information for this eVisit, you will not be charged. You may see a "hold" on your card for the $35 but that hold will drop off and you will not have a charge processed.   If you are having a true medical emergency please call 911.      For an urgent face to face visit, [facility] has six urgent care centers for your convenience:     [place] [facility] [place] Get Driving Directions [phone] [place], [place] [address] . 8 am - 4 pm Monday - Friday    [place] [facility]) Get Driving Directions [phone] [address] [place], [place] [address] . 8 am to 8 pm Monday-Friday . 10 am to 6 pm [facility]) Get Driving Directions [phone]  [address] [place],  [place]  [address] . 8 am to 8 pm Monday-Friday . 8 am to 4 pm [facility] Get Driving Directions [phone] [place], [place], [place] [address] . 8 am to 8 pm Monday-Friday . 8 am to 4 pm [facility] Get Driving Directions  [phone] [address] [place], [place] [address] . 8 am to 8 pm Monday-Friday . 8 am to 4 pm [facility] [place] Get Driving Directions [phone] [address], [place], [place] [address] . 8 am to 8 pm Monday-Friday . 8 am to 4 pm Saturday-Sunday     Your MyChart E-visit questionnaire  answers were reviewed by a board certified advanced clinical practitioner to complete your personal care plan based on your specific symptoms.  Thank you for using e-Visits.

## 2021-05-03 NOTE — Progress Notes (Signed)
Ms. Jill, Fields are scheduled for a virtual visit with your provider today.    Just as we do with appointments in the office, we must obtain your consent to participate.  Your consent will be active for this visit and any virtual visit you may have with one of our providers in the next 365 days.    If you have a MyChart account, I can also send a copy of this consent to you electronically.  All virtual visits are billed to your insurance company just like a traditional visit in the office.  As this is a virtual visit, video technology does not allow for your provider to perform a traditional examination.  This may limit your provider's ability to fully assess your condition.  If your provider identifies any concerns that need to be evaluated in person or the need to arrange testing such as labs, EKG, etc, we will make arrangements to do so.    Although advances in technology are sophisticated, we cannot ensure that it will always work on either your end or our end.  If the connection with a video visit is poor, we may have to switch to a telephone visit.  With either a video or telephone visit, we are not always able to ensure that we have a secure connection.   I need to obtain your verbal consent now.   Are you willing to proceed with your visit today?   Jill Fields has provided verbal consent on 05/03/2021 for a virtual visit (video or telephone).   Jill Daring, PA-C 05/03/2021  3:51 PM    MyChart Video Visit    Virtual Visit via Video Note   This visit type was conducted due to national recommendations for restrictions regarding the COVID-19 Pandemic (e.g. social distancing) in an effort to limit this patient's exposure and mitigate transmission in our community. This patient is at least at moderate risk for complications without adequate follow up. This format is felt to be most appropriate for this patient at this time. Physical exam was limited by quality of the video and audio  technology used for the visit.   Patient location: Passenger in car, patient gave verbal ok that driver could hear call Provider location: Home office in [place]  I discussed the limitations of evaluation and management by telemedicine and the availability of in person appointments. The patient expressed understanding and agreed to proceed.  Patient: Jill Fields   DOB: [birth year]   35 y.o. Female  MRN: [id number] Visit Date: 05/03/2021  Today's healthcare provider: Mar Daring, PA-C   No chief complaint on file.  Subjective    HPI  Jill Fields is a 35 yr old female that presents via [place] for pain. She had an injury yesterday in [place] (was seen at local ER; AVS scanned in mychart under media tab by patient) and was diagnosed with a concussion and received 5 staples in her scalp for a laceration. She reports she is having intense pain, headache, dizziness and nausea. She was given Tramadol and Zofran at the ER. She reports she has taken almost all of the Tramadol she was given with ibuprofen, exedrin, and tylenol without relief. She reports she is also unable to take Zofran.  Patient Active Problem List   Diagnosis Date Noted  . Complex cyst of both ovaries 06/04/2019  . Endometriosis determined by laparoscopy 06/04/2019   Past Medical History:  Diagnosis Date  . Cervical cancer (HCC)       Medications:  Outpatient Medications Prior to Visit  Medication Sig  . albuterol (VENTOLIN HFA) 108 (90 Base) MCG/ACT inhaler Inhale 1-2 puffs into the lungs every 6 (six) hours as needed for wheezing or shortness of breath.  . doxycycline (VIBRA-TABS) 100 MG tablet Take 1 tablet (100 mg total) by mouth 2 (two) times daily.  . metoCLOPramide (REGLAN) 10 MG tablet Take 1 tablet (10 mg total) by mouth every 8 (eight) hours as needed for nausea or vomiting.  . promethazine-dextromethorphan (PROMETHAZINE-DM) 6.25-15 MG/5ML syrup Take 5 mLs by mouth 4 (four) times daily as  needed for cough.   No facility-administered medications prior to visit.    Review of Systems  Constitutional: Negative.   Eyes: Positive for visual disturbance.  Gastrointestinal: Positive for nausea.  Neurological: Positive for dizziness, light-headedness and headaches.    Last CBC No results found for: WBC, HGB, HCT, MCV, MCH, RDW, PLT Last metabolic panel No results found for: GLUCOSE, NA, K, CL, CO2, BUN, CREATININE, GFRNONAA, GFRAA, CALCIUM, PHOS, PROT, ALBUMIN, LABGLOB, AGRATIO, BILITOT, ALKPHOS, AST, ALT, ANIONGAP    Objective    There were no vitals taken for this visit. BP Readings from Last 3 Encounters:  04/06/21 109/64  08/30/20 110/76  08/19/20 104/69   Wt Readings from Last 3 Encounters:  08/30/20 116 lb (52.6 kg)      Physical Exam Vitals reviewed.  Constitutional:      General: She is not in acute distress.    Appearance: Normal appearance. She is well-developed. She is not ill-appearing.  HENT:     Head: Normocephalic and atraumatic.  Pulmonary:     Effort: Pulmonary effort is normal. No respiratory distress.  Musculoskeletal:     Cervical back: Normal range of motion and neck supple.  Neurological:     Mental Status: She is alert.  Psychiatric:        Attention and Perception: Attention and perception normal.        Mood and Affect: Affect is tearful (became tearful when discussing the pain she is in).        Speech: Speech normal.        Behavior: Behavior normal.        Thought Content: Thought content normal.        Judgment: Judgment normal.        Assessment & Plan     1. Concussion without loss of consciousness, sequela ([facility]) - Since patient is within 48 hours of head trauma with significant head pain, dizziness, and nausea, she was advised to go to the closest ER for further evaluation and pain management.  - She voiced understanding and agreed to proceed to ER  2. Laceration of scalp without foreign body, sequela -See above  medical treatment plan.  3. Acute post-traumatic headache, not intractable -See above medical treatment plan.   No follow-ups on file.     I discussed the assessment and treatment plan with the patient. The patient was provided an opportunity to ask questions and all were answered. The patient agreed with the plan and demonstrated an understanding of the instructions.   The patient was advised to call back or seek an in-person evaluation if the symptoms worsen or if the condition fails to improve as anticipated.  I provided 5 minutes of face-to-face time during this encounter via MyChart Video enabled encounter.   [place] [phone] (phone) [phone] (fax)  [place]

## 2021-05-12 ENCOUNTER — Ambulatory Visit
Admission: RE | Admit: 2021-05-12 | Discharge: 2021-05-12 | Disposition: A | Discharge status: Home / Self Care | Payer: Medicaid Other | Source: Ambulatory Visit

## 2021-05-12 ENCOUNTER — Other Ambulatory Visit: Payer: Self-pay

## 2021-05-12 VITALS — BP 98/60 | HR 78 | Temp 98.4°F | Resp 16

## 2021-05-12 DIAGNOSIS — S0990XD Unspecified injury of head, subsequent encounter: Secondary | ICD-10-CM

## 2021-05-12 DIAGNOSIS — G43009 Migraine without aura, not intractable, without status migrainosus: Secondary | ICD-10-CM

## 2021-05-12 MED ORDER — DEXAMETHASONE SODIUM PHOSPHATE 10 MG/ML IJ SOLN
10.0000 mg | Freq: Once | INTRAMUSCULAR | Status: AC
  Administered 2021-05-12: 10 mg via INTRAMUSCULAR

## 2021-05-12 MED ORDER — SUMATRIPTAN SUCCINATE 6 MG/0.5ML ~~LOC~~ SOLN
6.0000 mg | Freq: Once | SUBCUTANEOUS | Status: AC
  Administered 2021-05-12: 6 mg via SUBCUTANEOUS

## 2021-05-12 NOTE — ED Triage Notes (Signed)
Staples in head x 1 week.  States she is having a headache.

## 2021-05-12 NOTE — ED Provider Notes (Signed)
[facility]   [id number] 05/12/21 Arrival Time: 1613  EP:PIRJJOAC  SUBJECTIVE:  Jill Fields is a 35 y.o. female who complains of intermittent daily headaches x 1 week.  Smacked head on porch, obtained laceration to top of head.  Was seen in the ED, per patient CT Scan was negative.  Patient localizes her pain to the top of head.  Describes the pain as intermittent and throbbing in character.  Patient has tried OTC tylenol with relief. Symptoms are made worse with light.  This is not the worst headache of their life.  Patient denies fever, chills, nausea, vomiting, aura, rhinorrhea, watery eyes, chest pain, SOB, abdominal pain, weakness, numbness or tingling, slurred speech.     ROS: As per HPI.  All other pertinent ROS negative.     Past Medical History:  Diagnosis Date   Cervical cancer ([facility] [place])    Past Surgical History:  Procedure Laterality Date   ABDOMINAL HYSTERECTOMY     TONSILLECTOMY     Allergies  Allergen Reactions   Bee Venom Anaphylaxis   Cat Hair Extract Anaphylaxis   Peanut Oil Anaphylaxis   Ambien [Zolpidem]    Latex    No current facility-administered medications on file prior to encounter.   Current Outpatient Medications on File Prior to Encounter  Medication Sig Dispense Refill   clonazePAM (KLONOPIN) 1 MG tablet Take 1 mg by mouth 2 (two) times daily.     albuterol (VENTOLIN HFA) 108 (90 Base) MCG/ACT inhaler Inhale 1-2 puffs into the lungs every 6 (six) hours as needed for wheezing or shortness of breath. 18 g 0   metoCLOPramide (REGLAN) 10 MG tablet Take 1 tablet (10 mg total) by mouth every 8 (eight) hours as needed for nausea or vomiting. 30 tablet 0   [DISCONTINUED] cetirizine (ZYRTEC ALLERGY) 10 MG tablet Take 1 tablet (10 mg total) by mouth daily. 30 tablet 0   [DISCONTINUED] fluticasone (FLONASE) 50 MCG/ACT nasal spray Place 1 spray into both nostrils daily for 14 days. 16 g 0   Social History   Socioeconomic History   Marital status:  Single    Spouse name: Not on file   Number of children: Not on file   Years of education: Not on file   Highest education level: Not on file  Occupational History   Not on file  Tobacco Use   Smoking status: Some Days    Pack years: 0.00   Smokeless tobacco: Never  Substance and Sexual Activity   Alcohol use: Not Currently   Drug use: Not Currently   Sexual activity: Not on file  Other Topics Concern   Not on file  Social History Narrative   Not on file   Social Determinants of Health   Financial Resource Strain: Not on file  Food Insecurity: Not on file  Transportation Needs: Not on file  Physical Activity: Not on file  Stress: Not on file  Social Connections: Not on file  Intimate Partner Violence: Not on file   Family History  Family history unknown: Yes    OBJECTIVE:  Vitals:   05/12/21 1659  BP: 98/60  Pulse: 78  Resp: 16  Temp: 98.4 F (36.9 C)  TempSrc: Oral  SpO2: 98%    General appearance: alert; no distress Eyes: PERRLA; EOMI HENT: normocephalic; atraumatic; laceration to top of head healing without obvious swelling, redness, or drainage, scab formation evident Neck: supple with FROM Lungs: clear to auscultation bilaterally Heart: regular rate and rhythm.   Extremities: no  edema; symmetrical with no gross deformities Skin: warm and dry Neurologic: CN 2-12 grossly intact; finger to nose without difficulty; strength and sensation intact bilaterally about the upper and lower extremities; negative pronator drift Psychological: alert and cooperative; normal mood and affect   ASSESSMENT & PLAN:  1. Migraine without aura and without status migrainosus, not intractable   2. Traumatic injury of head, subsequent encounter     Meds ordered this encounter  Medications   dexamethasone (DECADRON) injection 10 mg   SUMAtriptan (IMITREX) injection 6 mg   Unable to rule out brain bleed in urgent care setting.  Offered patient further evaluation and  management in the ED.  Patient declines at this time and would like to try outpatient therapy first.  Aware of the risk associated with this decision including missed diagnosis, organ damage, organ failure, and/or death.  Patient aware and in agreement.     Migraine cocktail given in office Rest and drink plenty of fluids Use OTC medications as needed for symptomatic relief Follow up with PCP if symptoms persists Return or go to the ER if you have any new or worsening symptoms such as fever, chills, nausea, vomiting, chest pain, shortness of breath, cough, vision changes, worsening headache despite treatment, slurred speech, facial asymmetry, weakness in arms or legs, etc...  Reviewed expectations re: course of current medical issues. Questions answered. Outlined signs and symptoms indicating need for more acute intervention. Patient verbalized understanding. After Visit Summary given.    Lestine Box, PA-C 05/12/21 1851

## 2021-05-12 NOTE — ED Notes (Signed)
5 staples removed from head

## 2021-05-12 NOTE — Discharge Instructions (Addendum)
Unable to rule out brain bleed in urgent care setting.  Offered patient further evaluation and management in the ED.  Patient declines at this time and would like to try outpatient therapy first.  Aware of the risk associated with this decision including missed diagnosis, organ damage, organ failure, and/or death.  Patient aware and in agreement.     Migraine cocktail given in office Rest and drink plenty of fluids Use OTC medications as needed for symptomatic relief Follow up with PCP if symptoms persists Return or go to the ER if you have any new or worsening symptoms such as fever, chills, nausea, vomiting, chest pain, shortness of breath, cough, vision changes, worsening headache despite treatment, slurred speech, facial asymmetry, weakness in arms or legs, etc..Marland Kitchen

## 2021-05-17 ENCOUNTER — Telehealth: Payer: Medicaid Other | Admitting: Physician Assistant

## 2021-05-17 DIAGNOSIS — J4530 Mild persistent asthma, uncomplicated: Secondary | ICD-10-CM | POA: Diagnosis not present

## 2021-05-17 MED ORDER — ALBUTEROL SULFATE HFA 108 (90 BASE) MCG/ACT IN AERS: 1.0000 | INHALATION_SPRAY | Freq: Four times a day (QID) | RESPIRATORY_TRACT | 1 refills | Status: DC | PRN

## 2021-05-17 NOTE — Patient Instructions (Signed)
  [name], thank you for joining [name], PA-C for today's virtual visit.  While this provider is not your primary care provider (PCP), if your PCP is located in our provider database this encounter information will be shared with them immediately following your visit.  Consent: (Patient) Jill Fields provided verbal consent for this virtual visit at the beginning of the encounter.  Current Medications:  Current Outpatient Medications:    albuterol (VENTOLIN HFA) 108 (90 Base) MCG/ACT inhaler, Inhale 1-2 puffs into the lungs every 6 (six) hours as needed for wheezing or shortness of breath., Disp: 18 g, Rfl: 1   clonazePAM (KLONOPIN) 1 MG tablet, Take 1 mg by mouth 2 (two) times daily., Disp: , Rfl:    metoCLOPramide (REGLAN) 10 MG tablet, Take 1 tablet (10 mg total) by mouth every 8 (eight) hours as needed for nausea or vomiting., Disp: 30 tablet, Rfl: 0   Medications ordered in this encounter:  Meds ordered this encounter  Medications   albuterol (VENTOLIN HFA) 108 (90 Base) MCG/ACT inhaler    Sig: Inhale 1-2 puffs into the lungs every 6 (six) hours as needed for wheezing or shortness of breath.    Dispense:  18 g    Refill:  1    Order Specific Question:   Supervising Provider    Answer:   Sabra Heck, [place]     *If you need refills on other medications prior to your next appointment, please contact your pharmacy*  Follow-Up: Call back or seek an in-person evaluation if the symptoms worsen or if the condition fails to improve as anticipated.  Other Instructions Refill has been sent to the pharmacy for you.  Please use resources below to help get scheduled with a new primary provider.  Take care!  If you have been instructed to have an in-person evaluation today at a local Urgent Care facility, please use the link below. It will take you to a list of all of our available [facility], including address, phone number and hours of operation. Please  do not delay care.  [facility]  If you or a family member do not have a primary care provider, use the link below to schedule a visit and establish care. When you choose a [facility] primary care physician or advanced practice provider, you gain a long-term partner in health. Find a Primary Care Provider  Learn more about [facility]'s in-office and virtual care options: [facility]

## 2021-05-17 NOTE — Progress Notes (Signed)
Virtual Visit Consent   Jill Fields, you are scheduled for a virtual visit with a [facility] provider today.     Just as with appointments in the office, your consent must be obtained to participate.  Your consent will be active for this visit and any virtual visit you may have with one of our providers in the next 365 days.     If you have a MyChart account, a copy of this consent can be sent to you electronically.  All virtual visits are billed to your insurance company just like a traditional visit in the office.    As this is a virtual visit, video technology does not allow for your provider to perform a traditional examination.  This may limit your provider's ability to fully assess your condition.  If your provider identifies any concerns that need to be evaluated in person or the need to arrange testing (such as labs, EKG, etc.), we will make arrangements to do so.     Although advances in technology are sophisticated, we cannot ensure that it will always work on either your end or our end.  If the connection with a video visit is poor, the visit may have to be switched to a telephone visit.  With either a video or telephone visit, we are not always able to ensure that we have a secure connection.     I need to obtain your verbal consent now.   Are you willing to proceed with your visit today?    Jill Fields has provided verbal consent on 05/17/2021 for a virtual visit (video or telephone).   [name], [place]   Date: 05/17/2021 7:54 PM   Virtual Visit via Video Note   I, [name], connected [id number]@ ([id number], 10-22-1986) on 05/17/21 at  7:45 PM EDT by a video-enabled telemedicine application and verified that I am speaking with the correct person using two identifiers.  Location: Patient: Virtual Visit Location Patient: Home Provider: Virtual Visit Location Provider: Home Office   I discussed the limitations of evaluation and management by  telemedicine and the availability of in person appointments. The patient expressed understanding and agreed to proceed.    History of Present Illness: Jill Fields is a 35 y.o. who identifies as a female who was assigned female at birth, and is being seen today for possible refill of her Ventolin inhaler. Patient with history of mild asthma. Notes overall does very well but recently with the heat it is causing a flare of asthma symptoms -- chest tightness and wheezing periodically. Is out of medication and is between providers as her prior PCP has left practice and she cannot get in with a new provider at that practice as they are not taking transfers/new patients.   HPI: HPI  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   Endometriosis determined by laparoscopy 06/04/2019    Allergies:  Allergies  Allergen Reactions   Bee Venom Anaphylaxis   Cat Hair Extract Anaphylaxis   Peanut Oil Anaphylaxis   Ambien [Zolpidem]    Latex    Medications:  Current Outpatient Medications:    albuterol (VENTOLIN HFA) 108 (90 Base) MCG/ACT inhaler, Inhale 1-2 puffs into the lungs every 6 (six) hours as needed for wheezing or shortness of breath., Disp: 18 g, Rfl: 1   clonazePAM (KLONOPIN) 1 MG tablet, Take 1 mg by mouth 2 (two) times daily., Disp: , Rfl:  metoCLOPramide (REGLAN) 10 MG tablet, Take 1 tablet (10 mg total) by mouth every 8 (eight) hours as needed for nausea or vomiting., Disp: 30 tablet, Rfl: 0  Observations/Objective: Patient is well-developed, well-nourished in no acute distress.  Resting comfortably at home.  Head is normocephalic, atraumatic.  No labored breathing. Speech is clear and coherent with logical content.  Patient is alert and oriented at baseline.   Assessment and Plan: 1. Mild persistent asthma without complication - albuterol (VENTOLIN HFA) 108 (90 Base) MCG/ACT inhaler; Inhale 1-2 puffs into the lungs  every 6 (six) hours as needed for wheezing or shortness of breath.  Dispense: 18 g; Refill: 1 In between providers. Heat is causing episodic need for albuterol inhaler. Feel reasonable to refill for her while she is between providers. Sent her resources for getting scheduled with a [facility] PCP.   Follow Up Instructions: I discussed the assessment and treatment plan with the patient. The patient was provided an opportunity to ask questions and all were answered. The patient agreed with the plan and demonstrated an understanding of the instructions.  A copy of instructions were sent to the patient via MyChart.  The patient was advised to call back or seek an in-person evaluation if the symptoms worsen or if the condition fails to improve as anticipated.  Time:  I spent 10 minutes with the patient via telehealth technology discussing the above problems/concerns.    [name], PA-C

## 2021-05-18 ENCOUNTER — Telehealth: Payer: Medicaid Other | Admitting: Physician Assistant

## 2021-05-18 DIAGNOSIS — J4540 Moderate persistent asthma, uncomplicated: Secondary | ICD-10-CM

## 2021-05-18 MED ORDER — ALBUTEROL SULFATE HFA 108 (90 BASE) MCG/ACT IN AERS: 1.0000 | INHALATION_SPRAY | RESPIRATORY_TRACT | 2 refills | Status: DC | PRN

## 2021-05-18 MED ORDER — PROAIR DIGIHALER 108 (90 BASE) MCG/ACT IN AEPB: 1.0000 | INHALATION_SPRAY | RESPIRATORY_TRACT | 3 refills | Status: DC | PRN

## 2021-05-18 NOTE — Progress Notes (Signed)
Jill Fields, Jill Fields are scheduled for a virtual visit with your provider today.    Just as we do with appointments in the office, we must obtain your consent to participate.  Your consent will be active for this visit and any virtual visit you may have with one of our providers in the next 365 days.    If you have a MyChart account, I can also send a copy of this consent to you electronically.  All virtual visits are billed to your insurance company just like a traditional visit in the office.  As this is a virtual visit, video technology does not allow for your provider to perform a traditional examination.  This may limit your provider's ability to fully assess your condition.  If your provider identifies any concerns that need to be evaluated in person or the need to arrange testing such as labs, EKG, etc, we will make arrangements to do so.    Although advances in technology are sophisticated, we cannot ensure that it will always work on either your end or our end.  If the connection with a video visit is poor, we may have to switch to a telephone visit.  With either a video or telephone visit, we are not always able to ensure that we have a secure connection.   I need to obtain your verbal consent now.   Are you willing to proceed with your visit today?   Addisen Chappelle has provided verbal consent on 05/18/2021 for a virtual visit (video or telephone).   Mar Daring, PA-C 05/18/2021  9:07 AM  Virtual Visit Consent   Jill Fields, you are scheduled for a virtual visit with a [facility] provider today.     Just as with appointments in the office, your consent must be obtained to participate.  Your consent will be active for this visit and any virtual visit you may have with one of our providers in the next 365 days.     If you have a MyChart account, a copy of this consent can be sent to you electronically.  All virtual visits are billed to your insurance company just like a traditional  visit in the office.    As this is a virtual visit, video technology does not allow for your provider to perform a traditional examination.  This may limit your provider's ability to fully assess your condition.  If your provider identifies any concerns that need to be evaluated in person or the need to arrange testing (such as labs, EKG, etc.), we will make arrangements to do so.     Although advances in technology are sophisticated, we cannot ensure that it will always work on either your end or our end.  If the connection with a video visit is poor, the visit may have to be switched to a telephone visit.  With either a video or telephone visit, we are not always able to ensure that we have a secure connection.     I need to obtain your verbal consent now.   Are you willing to proceed with your visit today?    Jill Fields has provided verbal consent on 05/18/2021 for a virtual visit (video or telephone).   Mar Daring, PA-C   Date: 05/18/2021 9:07 AM   Virtual Visit via Video Note   Jill Fields, connected [email or web address]@ ([id number], Sep 28, 1986) on 05/18/21 at  9:00 AM EDT by a video-enabled telemedicine application and verified that I am speaking with the  correct person using two identifiers.  Location: Patient: Virtual Visit Location Patient: Home Provider: Virtual Visit Location Provider: Home Office   I discussed the limitations of evaluation and management by telemedicine and the availability of in person appointments. The patient expressed understanding and agreed to proceed.    History of Present Illness: Jill Fields is a 35 y.o. who identifies as a female who was assigned female at birth, and is being seen today for refill on her inhaler. She was seen yesterday via video visit for the same issue, however, the inhaler prescribed requires a prior authorization. She is needing this completed or inhaler changed.  HPI: HPI  Problems:  Patient Active Problem List    Diagnosis Date Noted   Complex cyst of both ovaries 06/04/2019   Endometriosis determined by laparoscopy 06/04/2019    Allergies:  Allergies  Allergen Reactions   Bee Venom Anaphylaxis   Cat Hair Extract Anaphylaxis   Peanut Oil Anaphylaxis   Ambien [Zolpidem]    Latex    Medications:  Current Outpatient Medications:    Albuterol Sulfate, sensor, (PROAIR DIGIHALER) 108 (90 Base) MCG/ACT AEPB, Inhale 1-2 puffs into the lungs every 4 (four) hours as needed., Disp: 1 each, Rfl: 3   clonazePAM (KLONOPIN) 1 MG tablet, Take 1 mg by mouth 2 (two) times daily., Disp: , Rfl:    metoCLOPramide (REGLAN) 10 MG tablet, Take 1 tablet (10 mg total) by mouth every 8 (eight) hours as needed for nausea or vomiting., Disp: 30 tablet, Rfl: 0  Observations/Objective: Patient is well-developed, well-nourished in no acute distress.  Resting comfortably on her bed at home.  Head is normocephalic, atraumatic.  No labored breathing. No audible wheezing. Speech is clear and coherent with logical content.  Patient is alert and oriented at baseline.   Assessment and Plan: 1. Moderate persistent asthma, unspecified whether complicated - Albuterol Sulfate, sensor, (PROAIR DIGIHALER) 108 (90 Base) MCG/ACT AEPB; Inhale 1-2 puffs into the lungs every 4 (four) hours as needed.  Dispense: 1 each; Refill: 3  Inhaler changed to Proair from Ventolin due to insurance.  Follow Up Instructions: I discussed the assessment and treatment plan with the patient. The patient was provided an opportunity to ask questions and all were answered. The patient agreed with the plan and demonstrated an understanding of the instructions.  A copy of instructions were sent to the patient via MyChart.  The patient was advised to call back or seek an in-person evaluation if the symptoms worsen or if the condition fails to improve as anticipated.  Time:  I spent 6 minutes with the patient via telehealth technology discussing the above  problems/concerns.    Mar Daring, PA-C

## 2021-05-22 ENCOUNTER — Ambulatory Visit: Payer: Medicaid Other | Admitting: Internal Medicine

## 2021-06-08 ENCOUNTER — Ambulatory Visit: Payer: Medicaid Other | Admitting: Internal Medicine

## 2021-06-26 ENCOUNTER — Encounter: Payer: Self-pay | Admitting: Physician Assistant

## 2021-06-26 ENCOUNTER — Telehealth: Payer: Medicaid Other | Admitting: Physician Assistant

## 2021-06-26 DIAGNOSIS — Z20822 Contact with and (suspected) exposure to covid-19: Secondary | ICD-10-CM

## 2021-06-26 MED ORDER — ONDANSETRON HCL 4 MG PO TABS: 4.0000 mg | ORAL_TABLET | Freq: Three times a day (TID) | ORAL | 0 refills | Status: DC | PRN

## 2021-06-26 MED ORDER — ALBUTEROL SULFATE HFA 108 (90 BASE) MCG/ACT IN AERS: 2.0000 | INHALATION_SPRAY | Freq: Four times a day (QID) | RESPIRATORY_TRACT | 0 refills | Status: DC | PRN

## 2021-06-26 MED ORDER — PSEUDOEPH-BROMPHEN-DM 30-2-10 MG/5ML PO SYRP: 5.0000 mL | ORAL_SOLUTION | Freq: Four times a day (QID) | ORAL | 0 refills | Status: DC | PRN

## 2021-06-26 NOTE — Patient Instructions (Signed)
Jill Fields, thank you for joining Mar Daring, PA-C for today's virtual visit.  While this provider is not your primary care provider (PCP), if your PCP is located in our provider database this encounter information will be shared with them immediately following your visit.  Consent: (Patient) Jill Fields provided verbal consent for this virtual visit at the beginning of the encounter.  Current Medications:  Current Outpatient Medications:    albuterol (VENTOLIN HFA) 108 (90 Base) MCG/ACT inhaler, Inhale 2 puffs into the lungs every 6 (six) hours as needed for wheezing or shortness of breath., Disp: 8 g, Rfl: 0   brompheniramine-pseudoephedrine-DM 30-2-10 MG/5ML syrup, Take 5 mLs by mouth 4 (four) times daily as needed., Disp: 120 mL, Rfl: 0   ondansetron (ZOFRAN) 4 MG tablet, Take 1 tablet (4 mg total) by mouth every 8 (eight) hours as needed for nausea or vomiting., Disp: 20 tablet, Rfl: 0   clonazePAM (KLONOPIN) 1 MG tablet, Take 1 mg by mouth 2 (two) times daily., Disp: , Rfl:    metoCLOPramide (REGLAN) 10 MG tablet, Take 1 tablet (10 mg total) by mouth every 8 (eight) hours as needed for nausea or vomiting., Disp: 30 tablet, Rfl: 0   Medications ordered in this encounter:  Meds ordered this encounter  Medications   albuterol (VENTOLIN HFA) 108 (90 Base) MCG/ACT inhaler    Sig: Inhale 2 puffs into the lungs every 6 (six) hours as needed for wheezing or shortness of breath.    Dispense:  8 g    Refill:  0    Order Specific Question:   Supervising Provider    Answer:   MILLER, BRIAN [[id number]]   ondansetron (ZOFRAN) 4 MG tablet    Sig: Take 1 tablet (4 mg total) by mouth every 8 (eight) hours as needed for nausea or vomiting.    Dispense:  20 tablet    Refill:  0    Order Specific Question:   Supervising Provider    Answer:   Sabra Heck, BRIAN [id number]   brompheniramine-pseudoephedrine-DM 30-2-10 MG/5ML syrup    Sig: Take 5 mLs by mouth 4 (four) times daily as needed.     Dispense:  120 mL    Refill:  0    Order Specific Question:   Supervising Provider    Answer:   Sabra Heck, BRIAN [[id number]]     *If you need refills on other medications prior to your next appointment, please contact your pharmacy*  Follow-Up: Call back or seek an in-person evaluation if the symptoms worsen or if the condition fails to improve as anticipated.  Other Instructions See Below   If you have been instructed to have an in-person evaluation today at a local Urgent Care facility, please use the link below. It will take you to a list of all of our available [facility], including address, phone number and hours of operation. Please do not delay care.  [facility]  If you or a family member do not have a primary care provider, use the link below to schedule a visit and establish care. When you choose a [facility] primary care physician or advanced practice provider, you gain a long-term partner in health. Find a Primary Care Provider  Learn more about 's in-office and virtual care options: [employer]     We are enrolling you in our [place] for [facility] . Daily you will receive a questionnaire within the [facility] website. Our COVID  19 response team will be monitoring your responses daily.  Testing Information: The COVID-19 Community Testing sites are testing BY APPOINTMENT ONLY.  You can schedule online at [email or web address]  If you do not have access to a smart phone or computer you may call [phone] for an appointment.   Additional testing sites in the Community:  For [facility] Testing sites in [place]  FaceUpdate.uy  For Pop-up testing sites in [place]  BowlDirectory.co.uk  For [facility]  BasicJet.ca  For [facility] testing in [place] and [employer] BasicJet.ca  For [facility] testing in [place]   [email or web address]  For  more information about community testing call [phone]   Please quarantine yourself while awaiting your test results. Please stay home for a minimum of 10 days from the first day of illness with improving symptoms and you have had 24 hours of no fever (without the use of Tylenol (Acetaminophen) Motrin (Ibuprofen) or any fever reducing medication).  Also - Do not get tested prior to returning to work because once you have had a positive test the test can stay positive for more than a month in some cases.   You should wear a mask or cloth face covering over your nose and mouth if you must be around other people or animals, including pets (even at home). Try to stay at least 6 feet away from other people. This will protect the people around you.  Please continue good preventive care measures, including:  frequent hand-washing, avoid touching your face, cover coughs/sneezes, stay out of crowds and keep a 6 foot distance from others.  COVID-19 is a respiratory illness with symptoms that are similar to the flu. Symptoms are typically mild to moderate, but there have been cases of severe illness and death due to the virus.   The following symptoms may appear 2-14 days after exposure: Fever Cough Shortness of breath or difficulty breathing Chills Repeated shaking with chills Muscle pain Headache Sore throat New loss of taste or smell Fatigue Congestion or runny nose Nausea or vomiting Diarrhea  Go to the nearest hospital ED for assessment if fever/cough/breathlessness are severe or illness seems like a threat to life.  It is  vitally important that if you feel that you have an infection such as this virus or any other virus that you stay home and away from places where you may spread it to others.  You should avoid contact with people age 73 and older.   You may also take acetaminophen (Tylenol) as needed for fever.  Reduce your risk of any infection by using the same precautions used for avoiding the common cold or flu:  Wash your hands often with soap and warm water for at least 20 seconds.  If soap and water are not readily available, use an alcohol-based hand sanitizer with at least 60% alcohol.  If coughing or sneezing, cover your mouth and nose by coughing or sneezing into the elbow areas of your shirt or coat, into a tissue or into your sleeve (not your hands). Avoid shaking hands with others and consider head nods or verbal greetings only. Avoid touching your eyes, nose, or mouth with unwashed hands.  Avoid close contact with people who are sick. Avoid places or events with large numbers of people in one location, like concerts or sporting events. Carefully consider travel plans you have or are making. If you are planning any travel outside or inside the [place], visit the CDC's Travelers' Health webpage for  the latest health notices. If you have some symptoms but not all symptoms, continue to monitor at home and seek medical attention if your symptoms worsen. If you are having a medical emergency, call 911.  HOME CARE Only take medications as instructed by your medical team. Drink plenty of fluids and get plenty of rest. A steam or ultrasonic humidifier can help if you have congestion.   GET HELP RIGHT AWAY IF YOU HAVE EMERGENCY WARNING SIGNS** FOR COVID-19. If you or someone is showing any of these signs seek emergency medical care immediately. Call 911 or proceed to your closest emergency facility if: You develop worsening high fever. Trouble breathing Bluish lips or face Persistent pain or pressure in the  chest New confusion Inability to wake or stay awake You cough up blood. Your symptoms become more severe  **This list is not all possible symptoms. Contact your medical provider for any symptoms that are sever or concerning to you.  MAKE SURE YOU  Understand these instructions. Will watch your condition. Will get help right away if you are not doing well or get worse.

## 2021-06-26 NOTE — Progress Notes (Signed)
Virtual Visit Consent   Reynolds Glidden, you are scheduled for a virtual visit with a [facility] provider today.     Just as with appointments in the office, your consent must be obtained to participate.  Your consent will be active for this visit and any virtual visit you may have with one of our providers in the next 365 days.     If you have a MyChart account, a copy of this consent can be sent to you electronically.  All virtual visits are billed to your insurance company just like a traditional visit in the office.    As this is a virtual visit, video technology does not allow for your provider to perform a traditional examination.  This may limit your provider's ability to fully assess your condition.  If your provider identifies any concerns that need to be evaluated in person or the need to arrange testing (such as labs, EKG, etc.), we will make arrangements to do so.     Although advances in technology are sophisticated, we cannot ensure that it will always work on either your end or our end.  If the connection with a video visit is poor, the visit may have to be switched to a telephone visit.  With either a video or telephone visit, we are not always able to ensure that we have a secure connection.     I need to obtain your verbal consent now.   Are you willing to proceed with your visit today?    Jill Fields has provided verbal consent on 06/26/2021 for a virtual visit (video or telephone).   Mar Daring, PA-C   Date: 06/26/2021 6:03 PM   Virtual Visit via Video Note   I, Mar Daring, connected with  Jill Fields  ([id number], 02-Jul-1986) on 06/26/21 at  6:00 PM EDT by a video-enabled telemedicine application and verified that I am speaking with the correct person using two identifiers.  Location: Patient: Virtual Visit Location Patient: Home Provider: Virtual Visit Location Provider: Home Office   I discussed the limitations of evaluation and management by  telemedicine and the availability of in person appointments. The patient expressed understanding and agreed to proceed.    History of Present Illness: Jill Fields is a 35 y.o. who identifies as a female who was assigned female at birth, and is being seen today for possible covid 70. She did have Covid 19 in 03/2021.  HPI: URI  This is a new problem. Episode onset: symptoms started 2 days ago, but worsened today. Maximum temperature: having hot and cold flashes, unable to check temperature. Associated symptoms include chest pain (heaviness and tightness), congestion, coughing, headaches, nausea, sinus pain and a sore throat. She has tried acetaminophen, increased fluids and sleep for the symptoms. The treatment provided mild relief.    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   Endometriosis determined by laparoscopy 06/04/2019    Allergies:  Allergies  Allergen Reactions   Bee Venom Anaphylaxis   Cat Hair Extract Anaphylaxis   Peanut Oil Anaphylaxis   Ambien [Zolpidem]    Latex    Medications:  Current Outpatient Medications:    albuterol (VENTOLIN HFA) 108 (90 Base) MCG/ACT inhaler, Inhale 2 puffs into the lungs every 6 (six) hours as needed for wheezing or shortness of breath., Disp: 8 g, Rfl: 0   brompheniramine-pseudoephedrine-DM 30-2-10 MG/5ML syrup, Take 5 mLs by mouth 4 (four) times  daily as needed., Disp: 120 mL, Rfl: 0   ondansetron (ZOFRAN) 4 MG tablet, Take 1 tablet (4 mg total) by mouth every 8 (eight) hours as needed for nausea or vomiting., Disp: 20 tablet, Rfl: 0   clonazePAM (KLONOPIN) 1 MG tablet, Take 1 mg by mouth 2 (two) times daily., Disp: , Rfl:    metoCLOPramide (REGLAN) 10 MG tablet, Take 1 tablet (10 mg total) by mouth every 8 (eight) hours as needed for nausea or vomiting., Disp: 30 tablet, Rfl: 0  Observations/Objective: Patient is well-developed, well-nourished in no acute distress.  Resting  comfortably at home.  Head is normocephalic, atraumatic.  No labored breathing.  Speech is clear and coherent with logical content.  Patient is alert and oriented at baseline.    Assessment and Plan: 1. Suspected COVID-19 virus infection - albuterol (VENTOLIN HFA) 108 (90 Base) MCG/ACT inhaler; Inhale 2 puffs into the lungs every 6 (six) hours as needed for wheezing or shortness of breath.  Dispense: 8 g; Refill: 0 - ondansetron (ZOFRAN) 4 MG tablet; Take 1 tablet (4 mg total) by mouth every 8 (eight) hours as needed for nausea or vomiting.  Dispense: 20 tablet; Refill: 0 - brompheniramine-pseudoephedrine-DM 30-2-10 MG/5ML syrup; Take 5 mLs by mouth 4 (four) times daily as needed.  Dispense: 120 mL; Refill: 0 - Advised to seek testing ASAP - Continue OTC symptomatic management of choice - Will send OTC vitamins and supplement information through AVS - Albuterol, Bromfed DM, and Zofran prescribed for symptomatic management - Patient enrolled in MyChart symptom monitoring - Push fluids - Rest as needed - Discussed return precautions and when to seek in-person evaluation, sent via AVS as well  Follow Up Instructions: I discussed the assessment and treatment plan with the patient. The patient was provided an opportunity to ask questions and all were answered. The patient agreed with the plan and demonstrated an understanding of the instructions.  A copy of instructions were sent to the patient via MyChart.  The patient was advised to call back or seek an in-person evaluation if the symptoms worsen or if the condition fails to improve as anticipated.  Time:  I spent 14 minutes with the patient via telehealth technology discussing the above problems/concerns.    Mar Daring, PA-C

## 2021-06-27 ENCOUNTER — Telehealth: Payer: Self-pay

## 2021-06-27 NOTE — Telephone Encounter (Signed)
Patient called, left VM to return the call to [phone] to speak to a nurse about worsening symptoms on MyChart COVID questionnaire.   [facility] Mychart Covid-19 Condition Monitoring  Question 06/27/2021  7:11 AM EDT - Filed by Patient  Are you feeling short of breath today? Yes  Is the shortness of breath better, the same, or worse than yesterday? Worse Abnormal   Are you having a cough today? Yes  Is the cough better, the same, or worse than yesterday?  Same  Are you experiencing weakness today? Yes  Is the weakness better, the same, or worse than yesterday? Worse Abnormal   Are you vomiting? Yes  How is your appetite compared to yesterday? Worse Abnormal   Are you experiencing diarrhea?  No

## 2021-06-28 ENCOUNTER — Encounter: Payer: Self-pay | Admitting: Physician Assistant

## 2021-06-28 DIAGNOSIS — U071 COVID-19: Secondary | ICD-10-CM

## 2021-06-29 ENCOUNTER — Telehealth: Payer: Self-pay

## 2021-06-29 MED ORDER — MOLNUPIRAVIR EUA 200MG CAPSULE: 4.0000 | ORAL_CAPSULE | Freq: Two times a day (BID) | ORAL | 0 refills | Status: AC

## 2021-06-29 NOTE — Telephone Encounter (Signed)
Patient called, left VM to return the call to [phone] to speak to a [place].  BPA triggered for worsening symptoms weakness.   [facility] Mychart Covid-19 Condition Monitoring  Question 06/28/2021  7:50 PM EDT - Filed by Patient  Are you feeling short of breath today? Yes  Is the shortness of breath better, the same, or worse than yesterday? Same  Are you having a cough today? Yes  Is the cough better, the same, or worse than yesterday?  Same  Are you experiencing weakness today? Yes  Is the weakness better, the same, or worse than yesterday? Worse Abnormal   Are you vomiting? Yes  How is your appetite compared to yesterday? Same  Are you experiencing diarrhea?  Yes  Is the diarrhea new today, better, the same, or worse than yesterday? Same

## 2021-07-10 ENCOUNTER — Ambulatory Visit: Payer: Medicaid Other | Admitting: Internal Medicine

## 2021-07-24 ENCOUNTER — Ambulatory Visit (INDEPENDENT_AMBULATORY_CARE_PROVIDER_SITE_OTHER): Payer: Medicaid Other

## 2021-07-24 ENCOUNTER — Encounter: Payer: Self-pay | Admitting: Emergency Medicine

## 2021-07-24 ENCOUNTER — Ambulatory Visit
Admission: EM | Admit: 2021-07-24 | Discharge: 2021-07-24 | Disposition: A | Discharge status: Home / Self Care | Payer: Medicaid Other | Attending: Family Medicine | Admitting: Family Medicine

## 2021-07-24 ENCOUNTER — Other Ambulatory Visit: Payer: Self-pay

## 2021-07-24 DIAGNOSIS — M79641 Pain in right hand: Secondary | ICD-10-CM | POA: Diagnosis not present

## 2021-07-24 DIAGNOSIS — T07XXXA Unspecified multiple injuries, initial encounter: Secondary | ICD-10-CM

## 2021-07-24 DIAGNOSIS — S46911A Strain of unspecified muscle, fascia and tendon at shoulder and upper arm level, right arm, initial encounter: Secondary | ICD-10-CM

## 2021-07-24 DIAGNOSIS — M25511 Pain in right shoulder: Secondary | ICD-10-CM

## 2021-07-24 DIAGNOSIS — W19XXXA Unspecified fall, initial encounter: Secondary | ICD-10-CM | POA: Diagnosis not present

## 2021-07-24 DIAGNOSIS — R079 Chest pain, unspecified: Secondary | ICD-10-CM

## 2021-07-24 IMAGING — DX DG RIBS W/ CHEST 3+V*R*
3 series · 3 of 3 positions shown · non-contrast
Comparison: Prior relevant studies for this patient are
unavailable, including right rib series done [DATE].

CLINICAL DATA: Right lateral chest pain after falling 1 day ago.

EXAM:
RIGHT RIBS AND CHEST - 3+ VIEW

[chest pa]
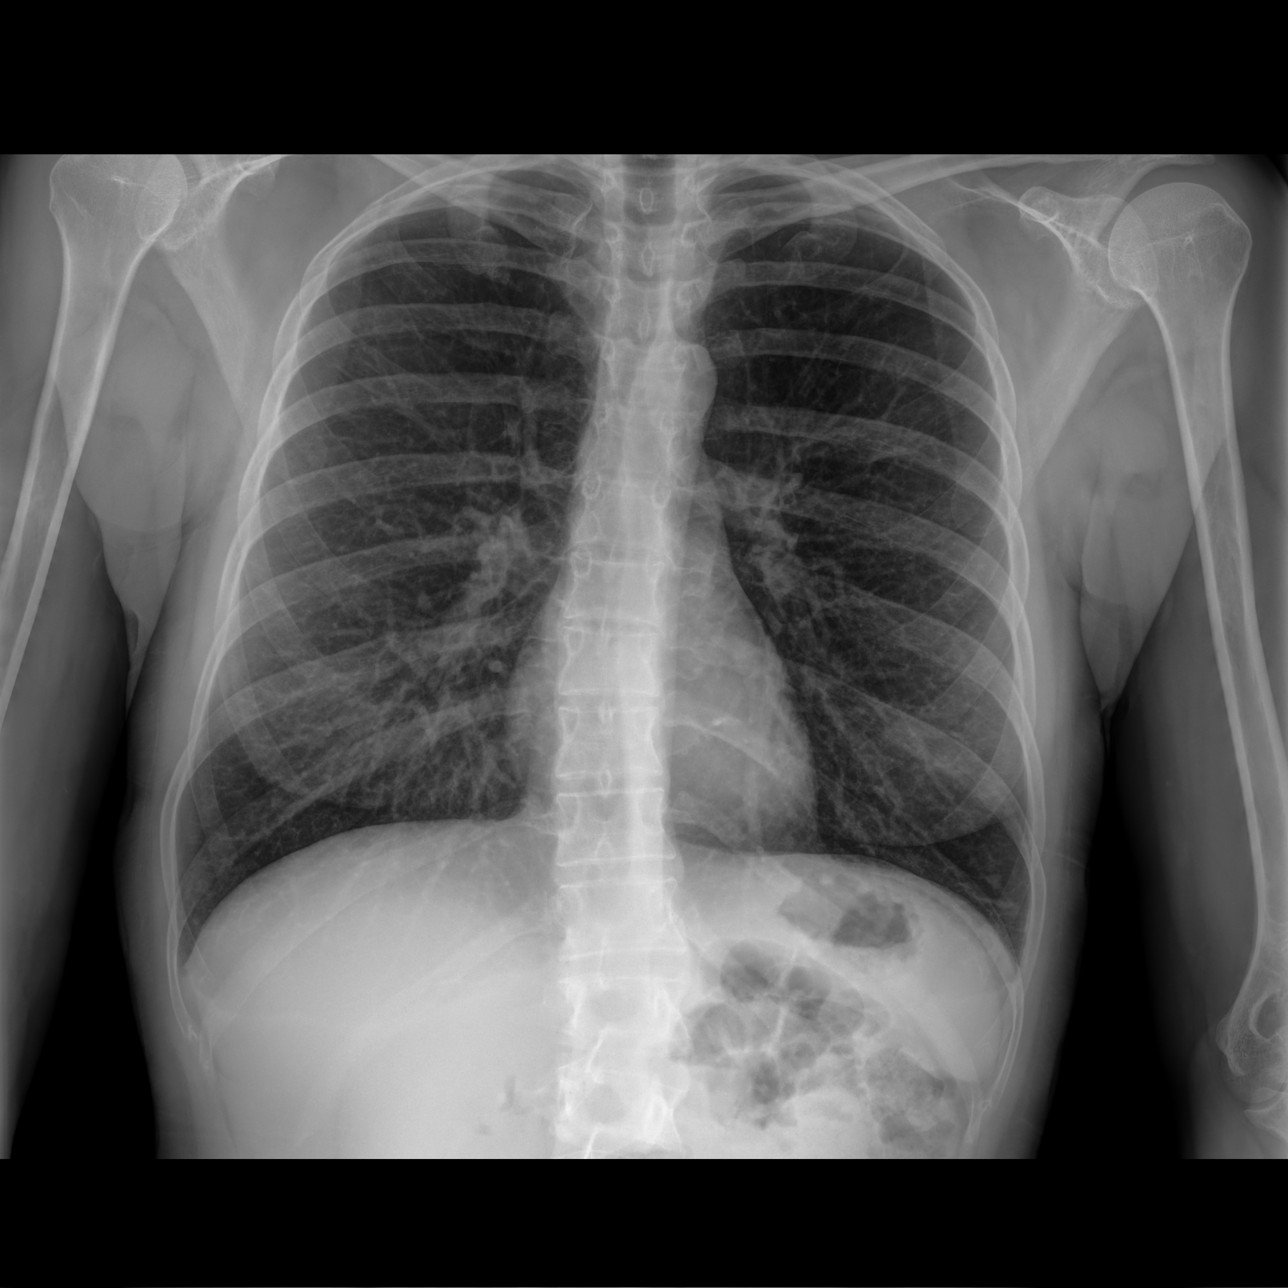

[hemithorax (ribs) ap]
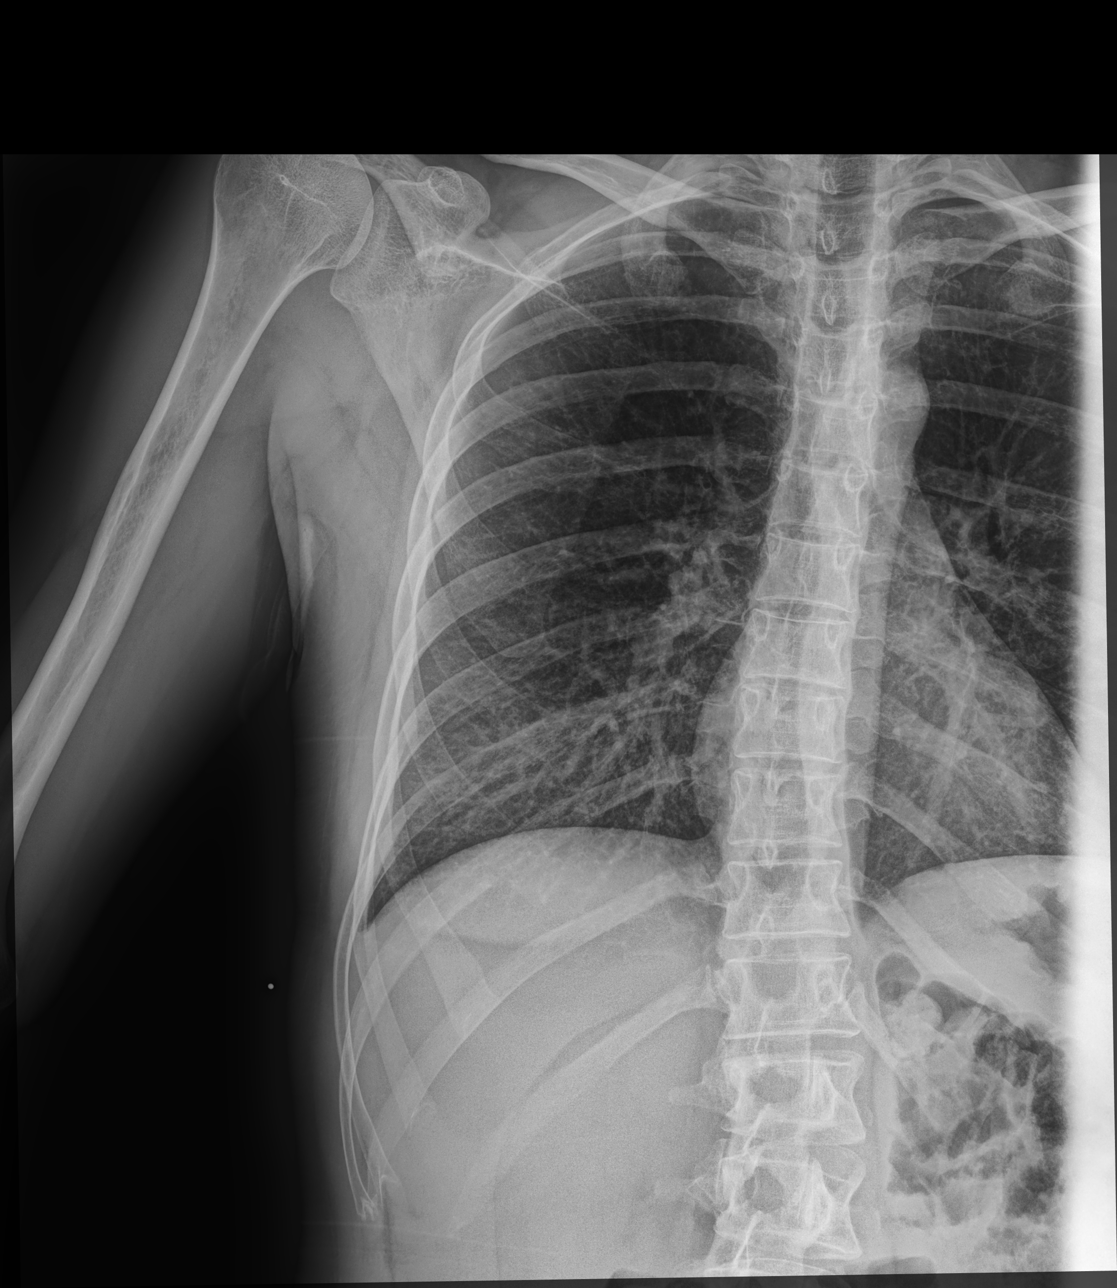

[hemithorax (ribs) mlo]
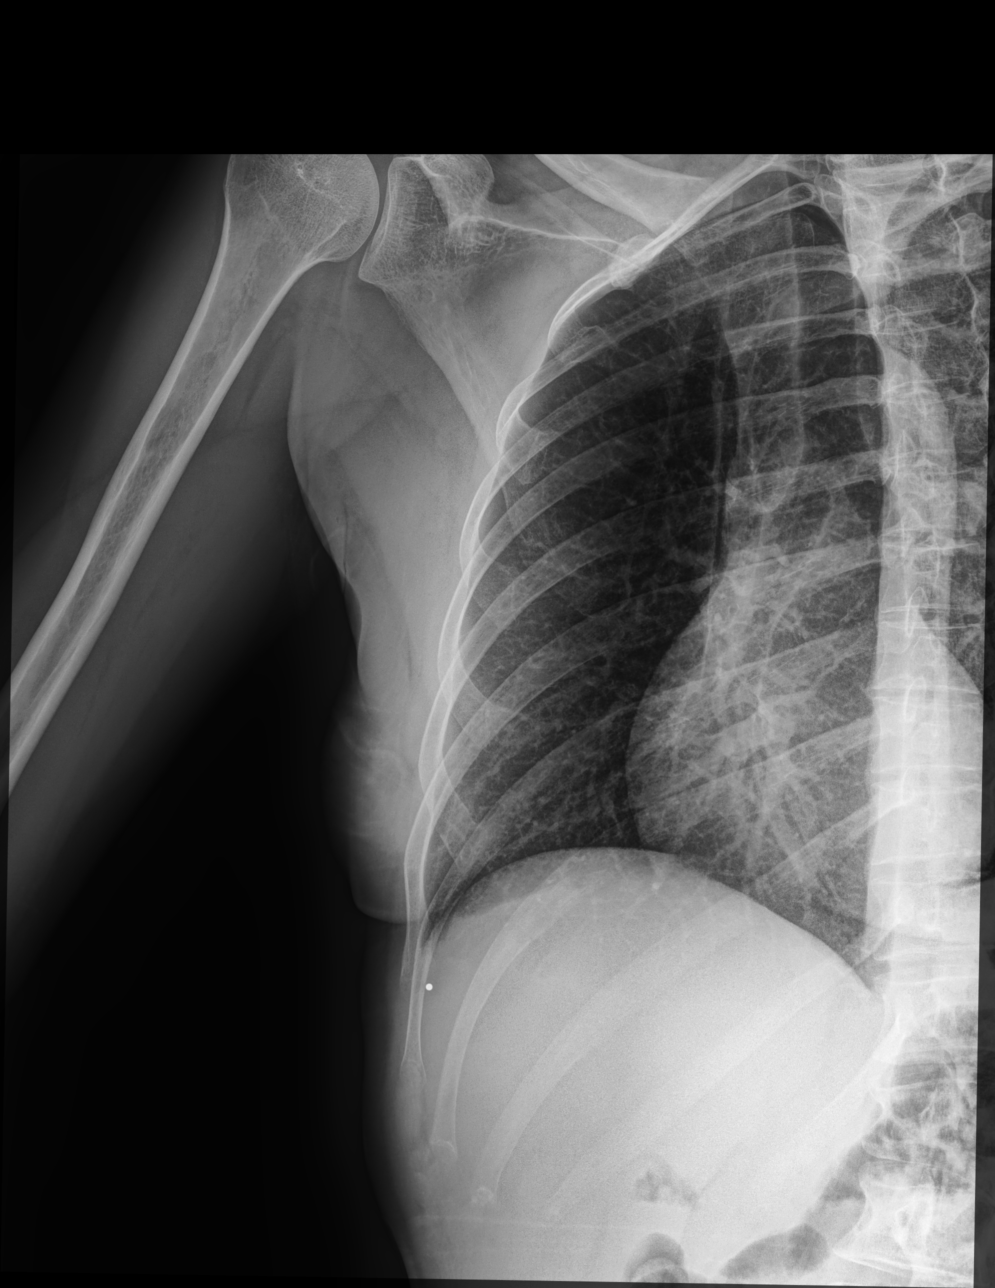

[3 of 3 positions shown; findings below may reference images not displayed]

FINDINGS: The heart size and mediastinal contours are normal. The lungs appear
clear. There is no pleural effusion or pneumothorax.

No evidence of acute right-sided rib fracture or focal rib lesion. A
metallic BB was placed over the area of pain inferolaterally.
IMPRESSION: No evidence of acute right-sided rib fracture, pleural effusion or
pneumothorax. Prior relevant studies unavailable.

## 2021-07-24 IMAGING — DX DG SHOULDER 2+V*R*
3 series · 3 of 3 positions shown · non-contrast
Comparison: None.

CLINICAL DATA: Fall.

EXAM:
RIGHT SHOULDER - 2+ VIEW

[shoulder internal rotation ap]
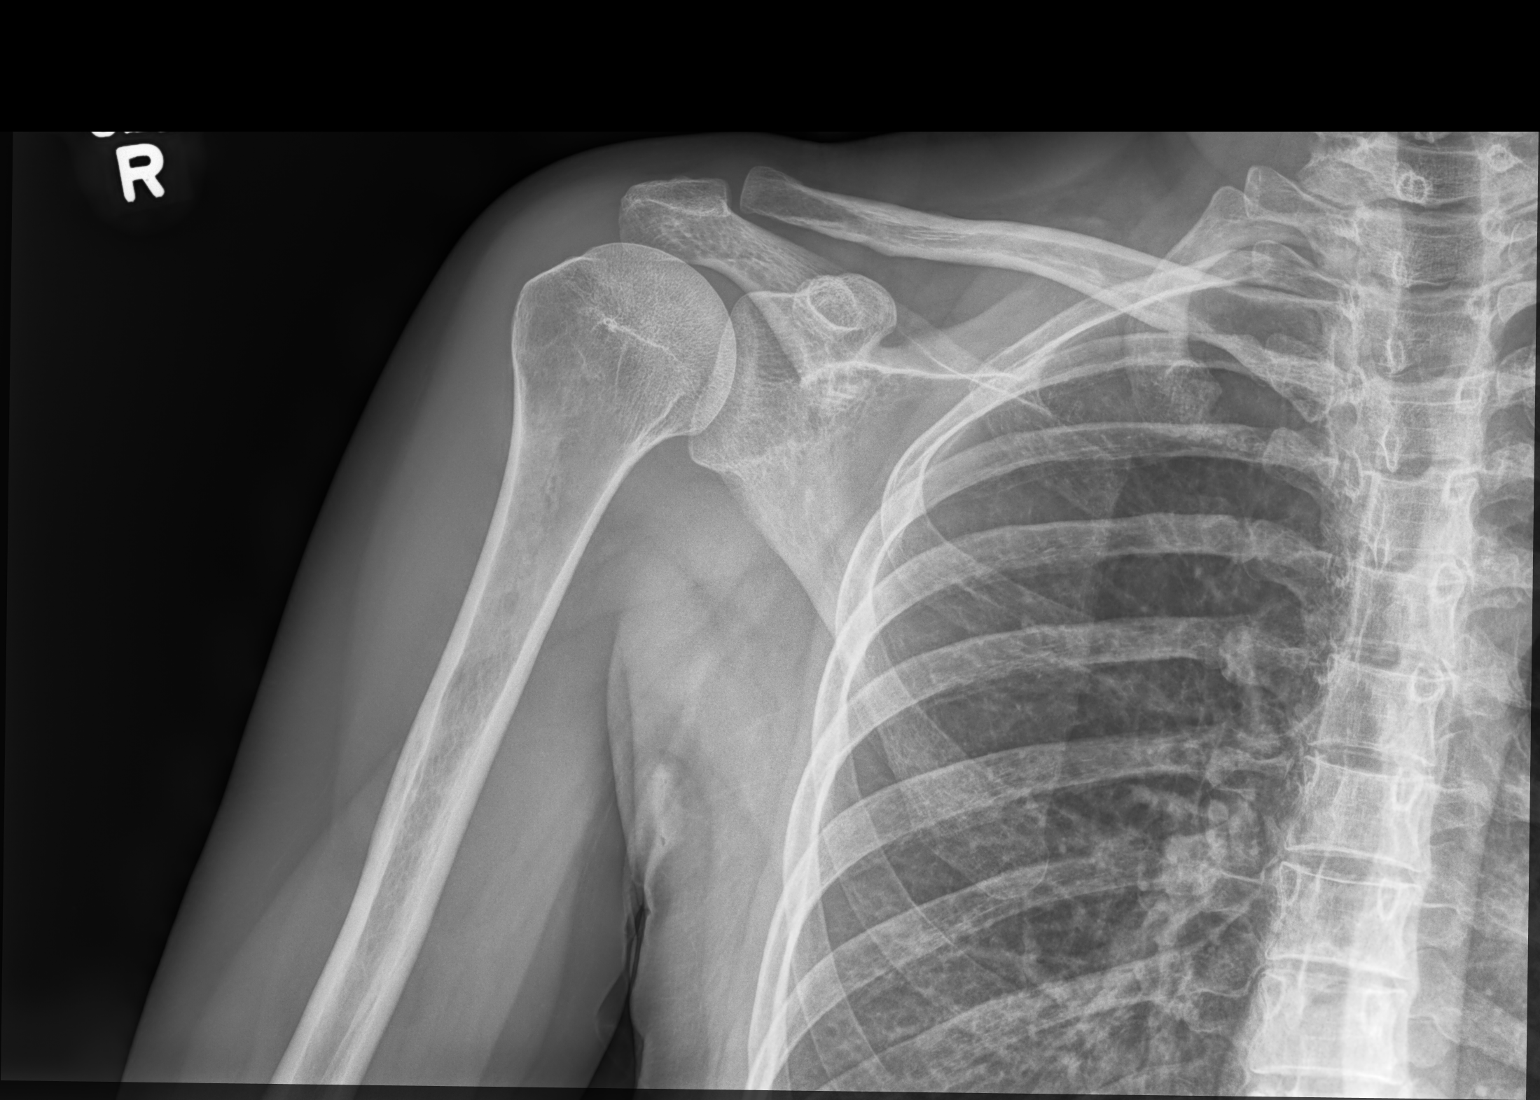

[shoulder external rotation ap]
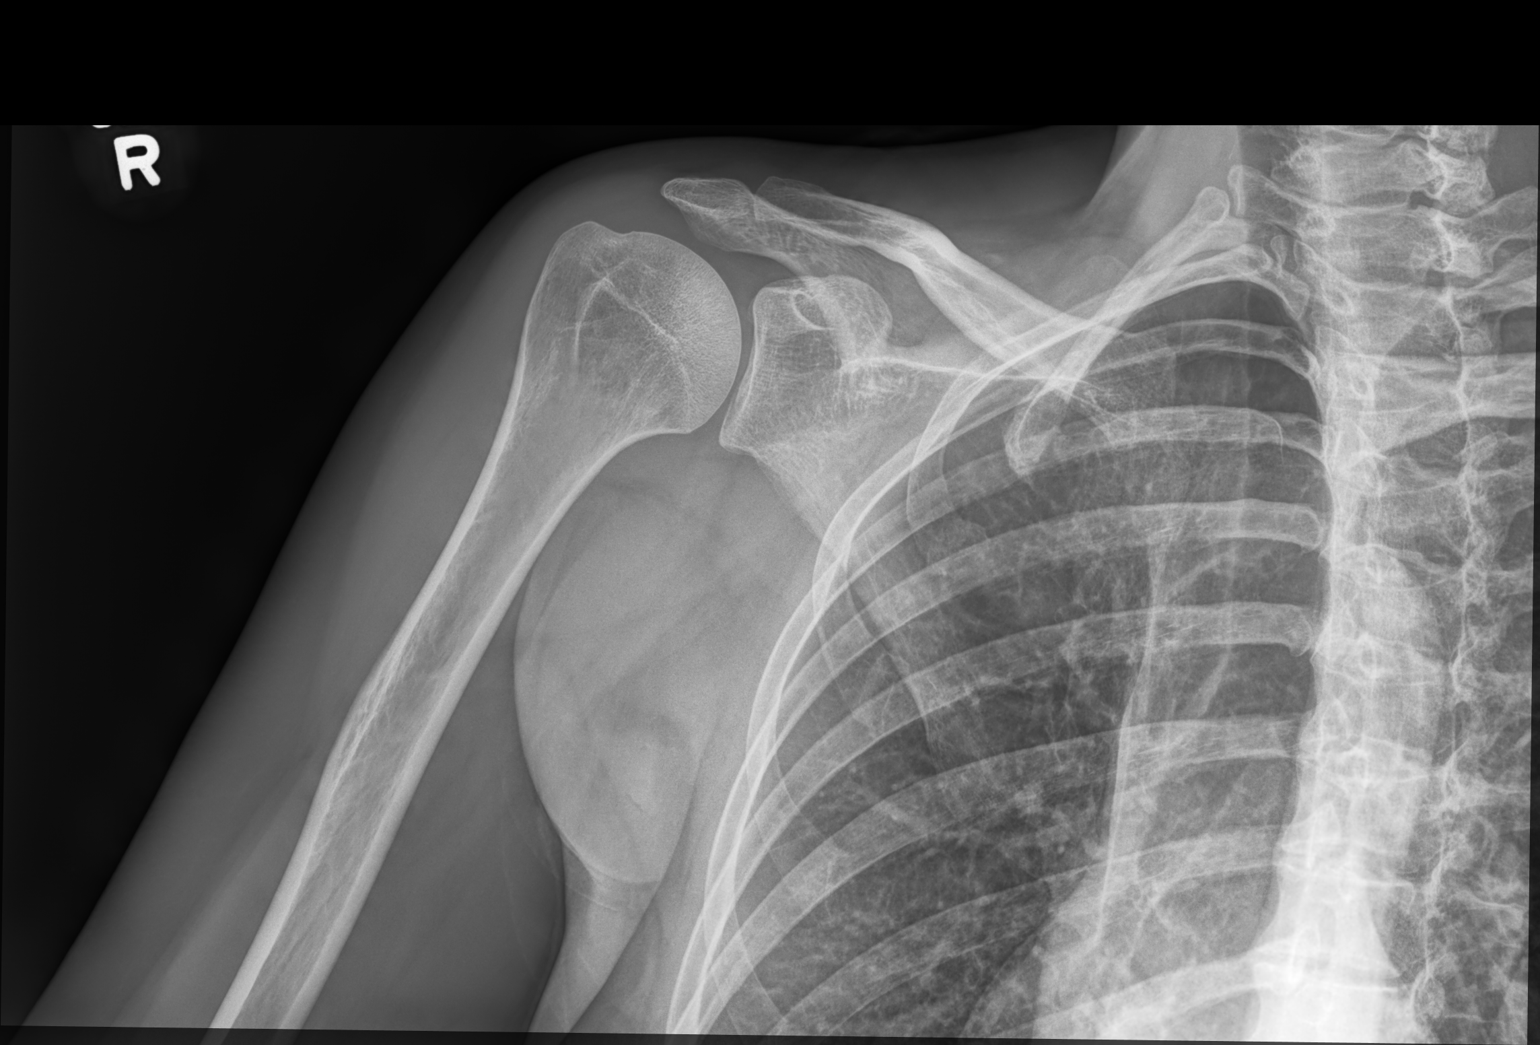

[shoulder (y view) lat]
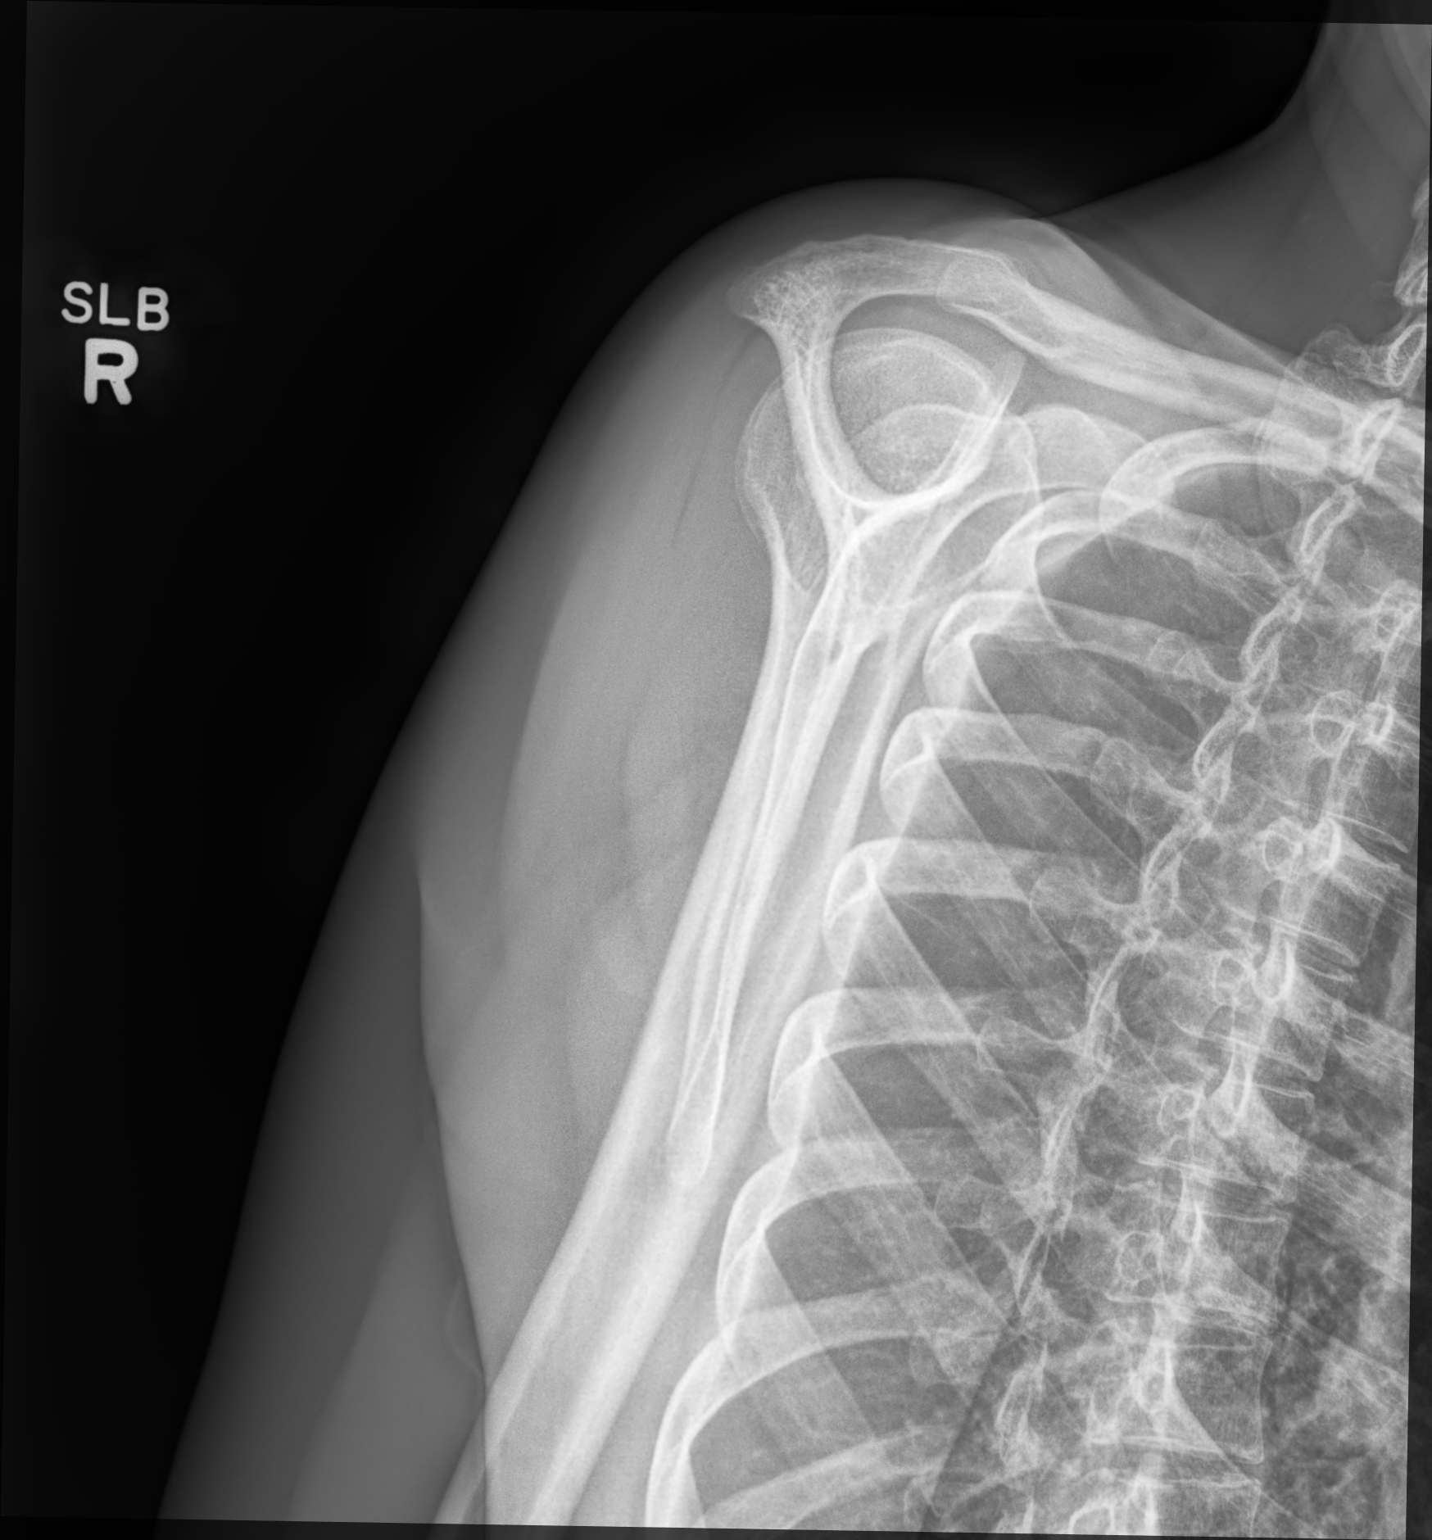

[3 of 3 positions shown; findings below may reference images not displayed]

FINDINGS: There is no evidence of fracture or dislocation. There is no
evidence of arthropathy or other focal bone abnormality. Soft
tissues are unremarkable.
IMPRESSION: Negative.

## 2021-07-24 IMAGING — DX DG HAND COMPLETE 3+V*R*
3 series · 3 of 3 positions shown · non-contrast
Comparison: None.

CLINICAL DATA: Fall with wrist injury yesterday.

EXAM:
RIGHT HAND - COMPLETE 3+ VIEW

[hand pa]
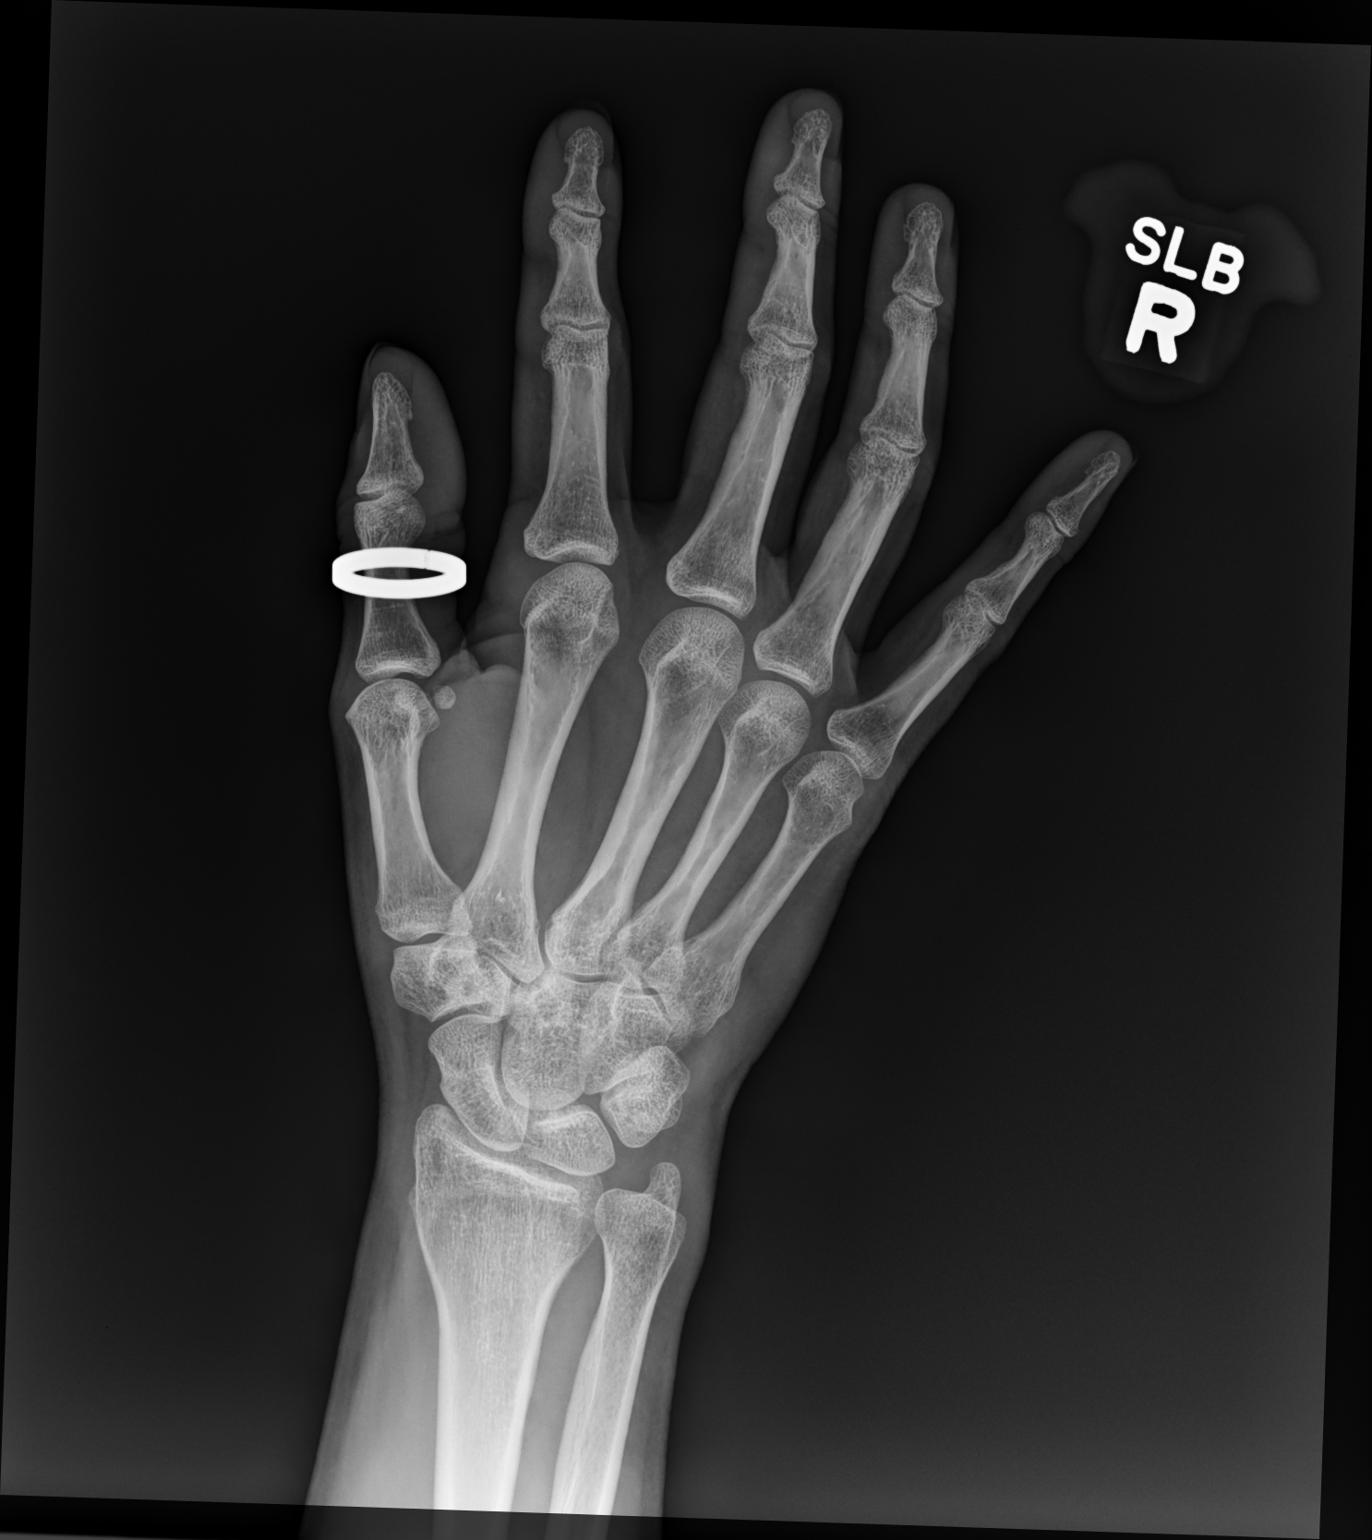

[hand mlo]
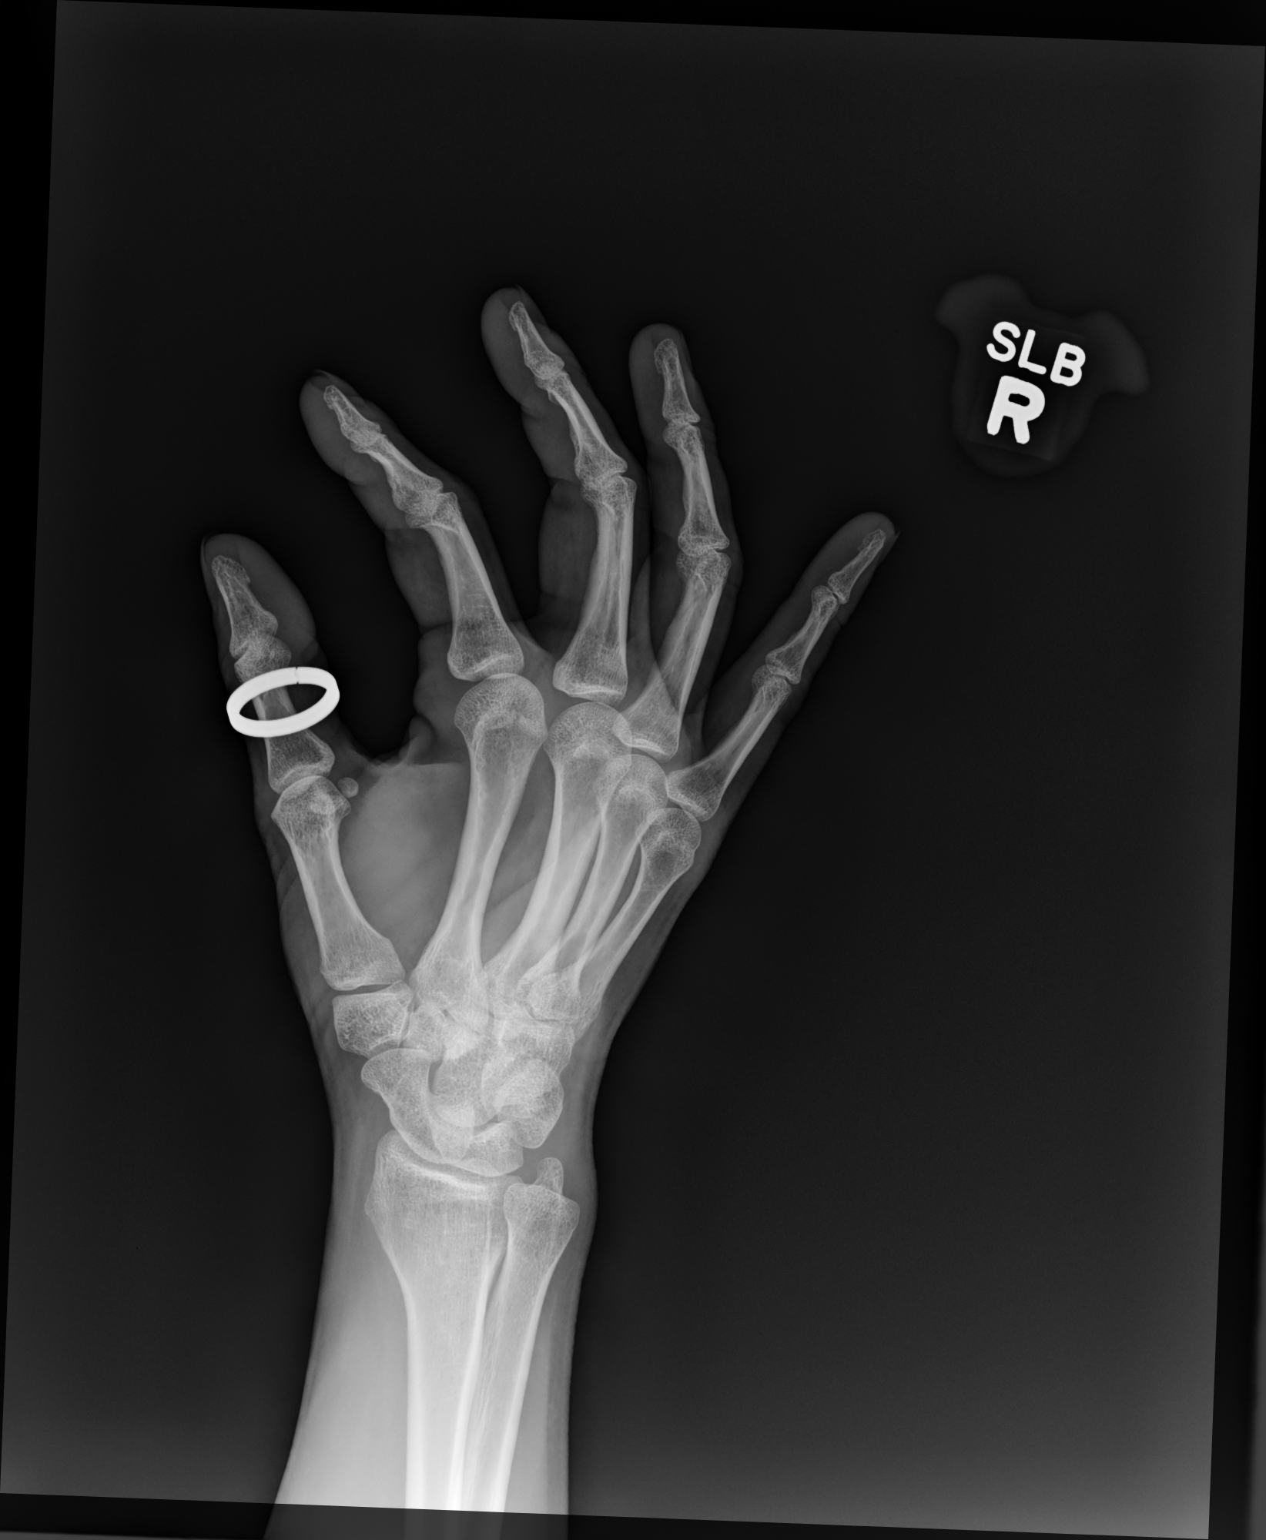

[hand lat]
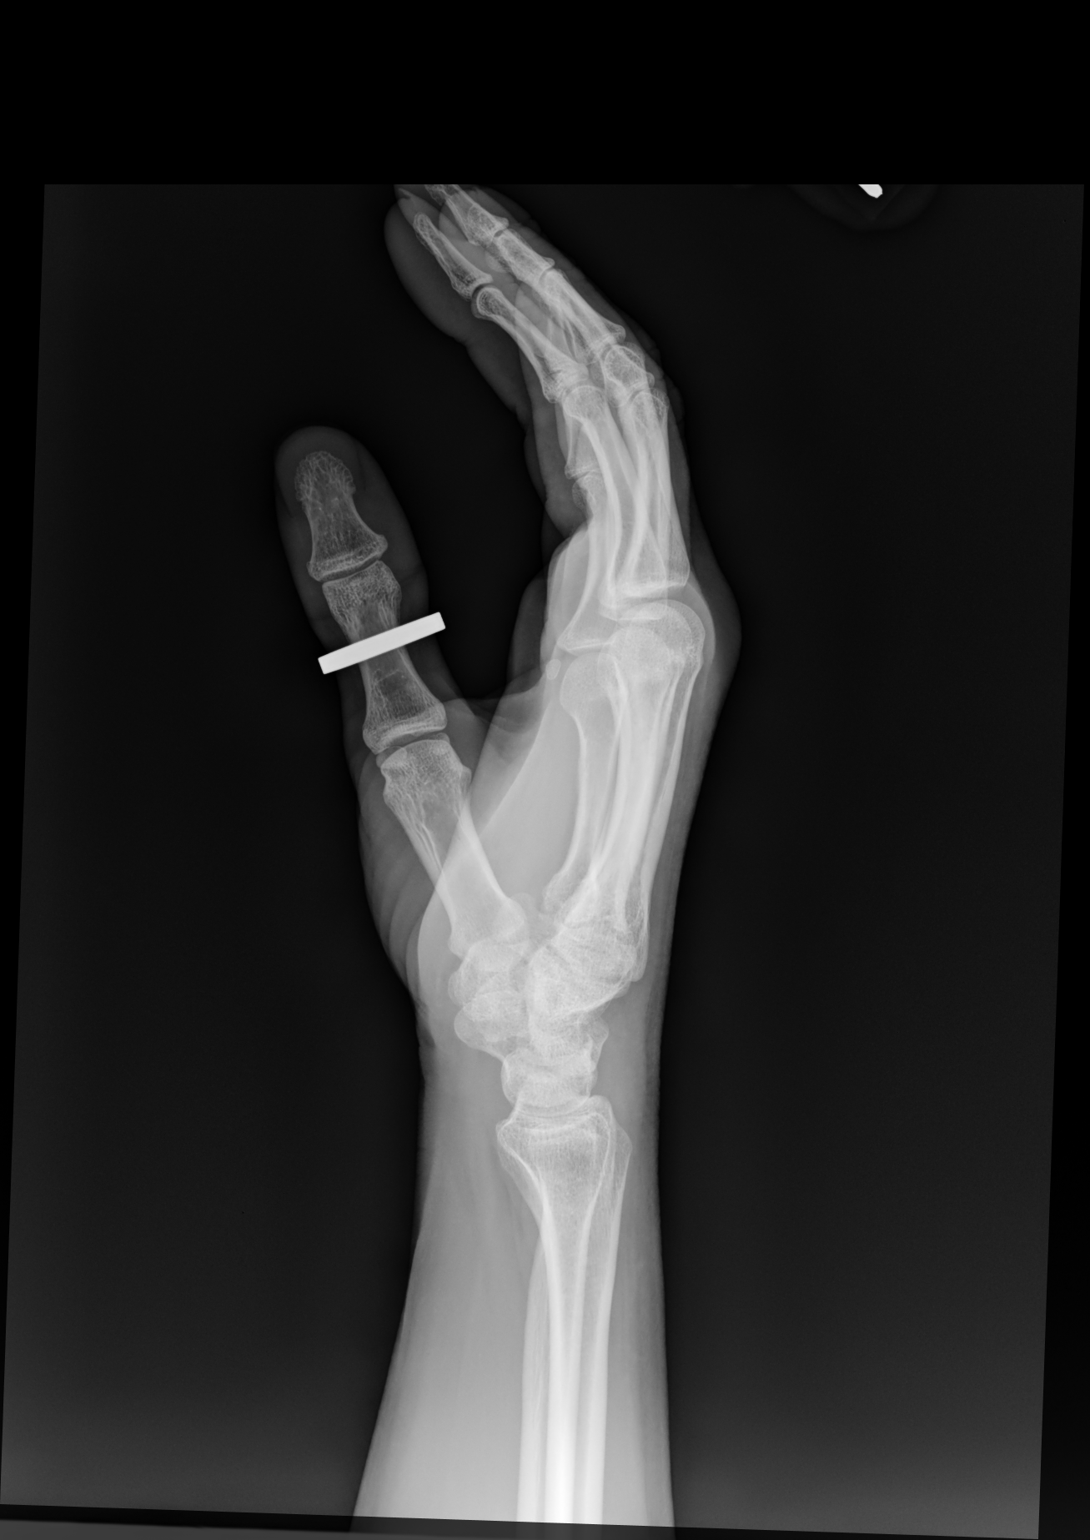

[3 of 3 positions shown; findings below may reference images not displayed]

FINDINGS: The mineralization and alignment are normal. There is no evidence of
acute fracture or dislocation. The joint spaces appear preserved.
There is a thumb ring. No other foreign body or focal soft tissue
abnormality identified.
IMPRESSION: No acute right hand findings identified.

## 2021-07-24 MED ORDER — TIZANIDINE HCL 4 MG PO TABS: 4.0000 mg | ORAL_TABLET | Freq: Three times a day (TID) | ORAL | 0 refills | Status: DC | PRN

## 2021-07-24 MED ORDER — PREDNISONE 20 MG PO TABS: 40.0000 mg | ORAL_TABLET | Freq: Every day | ORAL | 0 refills | Status: DC

## 2021-07-24 MED ORDER — KETOROLAC TROMETHAMINE 30 MG/ML IJ SOLN
30.0000 mg | Freq: Once | INTRAMUSCULAR | Status: AC
  Administered 2021-07-24: 30 mg via INTRAMUSCULAR

## 2021-07-24 NOTE — ED Triage Notes (Signed)
Tripped over dog leash yesterday and fell.  C/o pain to RT arm from shoulder to finger tips, RT ribs, middle of forehead and RT side of nose.

## 2021-07-24 NOTE — ED Provider Notes (Addendum)
[facility]    CSN: [id number] Arrival date & time: 07/24/21  [id number]      History   Chief Complaint Chief Complaint  Patient presents with   Fall    HPI Jill Fields is a 35 y.o. female.   HPI Patient presents today for evaluation of multiple injuries she sustained after falling and tripping over a dog leash yesterday.  Patient reports falling on her porch landing on the right side of her face and breaking her glasses and experiencing nose pain without diffuse bleeding, right shoulder pain, right wrist pain and right hand pain.  She also concerned that she may have fractured her right ribs as she fell directly onto the right side.  She also has a massive bruise under her right upper bicep she is ambulatory.  Reports taking over-the-counter ibuprofen earlier today without relief of pain. Past Medical History:  Diagnosis Date   Cervical cancer [facility])     Patient Active Problem List   Diagnosis Date Noted   Complex cyst of both ovaries 06/04/2019   Endometriosis determined by laparoscopy 06/04/2019    Past Surgical History:  Procedure Laterality Date   ABDOMINAL HYSTERECTOMY     TONSILLECTOMY      OB History   No obstetric history on file.      Home Medications    Prior to Admission medications   Medication Sig Start Date End Date Taking? Authorizing Provider  predniSONE (DELTASONE) 20 MG tablet Take 2 tablets (40 mg total) by mouth daily with breakfast. 07/24/21  Yes Scot Jun, FNP  tiZANidine (ZANAFLEX) 4 MG tablet Take 1 tablet (4 mg total) by mouth every 8 (eight) hours as needed for muscle spasms. 07/24/21  Yes Scot Jun, FNP  albuterol (VENTOLIN HFA) 108 (90 Base) MCG/ACT inhaler Inhale 2 puffs into the lungs every 6 (six) hours as needed for wheezing or shortness of breath. 06/26/21   Mar Daring, PA-C  brompheniramine-pseudoephedrine-DM 30-2-10 MG/5ML syrup Take 5 mLs by mouth 4 (four) times daily as needed. 06/26/21   Mar Daring, PA-C  clonazePAM (KLONOPIN) 1 MG tablet Take 1 mg by mouth 2 (two) times daily.    [provider]  metoCLOPramide (REGLAN) 10 MG tablet Take 1 tablet (10 mg total) by mouth every 8 (eight) hours as needed for nausea or vomiting. 04/06/21   Wurst, [place], PA-C  ondansetron (ZOFRAN) 4 MG tablet Take 1 tablet (4 mg total) by mouth every 8 (eight) hours as needed for nausea or vomiting. 06/26/21   Mar Daring, PA-C  cetirizine (ZYRTEC ALLERGY) 10 MG tablet Take 1 tablet (10 mg total) by mouth daily. 08/30/20 04/06/21  Avegno, Darrelyn Hillock, FNP  fluticasone (FLONASE) 50 MCG/ACT nasal spray Place 1 spray into both nostrils daily for 14 days. 08/30/20 04/06/21  AvegnoDarrelyn Hillock, FNP    Family History Family History  Family history unknown: Yes    Social History Social History   Tobacco Use   Smoking status: Some Days   Smokeless tobacco: Never  Substance Use Topics   Alcohol use: Not Currently   Drug use: Not Currently     Allergies   Bee venom, Cat hair extract, Peanut oil, Ambien [zolpidem], and Latex   Review of Systems Review of Systems Pertinent negatives listed in HPI   Physical Exam Triage Vital Signs ED Triage Vitals  Enc Vitals Group     BP 07/24/21 1009 114/70     Pulse Rate 07/24/21 1009 96  Resp 07/24/21 1009 17     Temp 07/24/21 1009 98.4 F (36.9 C)     Temp Source 07/24/21 1009 Oral     SpO2 07/24/21 1009 99 %     Weight --      Height --      Head Circumference --      Peak Flow --      Pain Score 07/24/21 1010 8     Pain Loc --      Pain Edu? --      Excl. in [place]? --    No data found.  Updated Vital Signs BP 114/70 (BP Location: Right Arm)   Pulse 96   Temp 98.4 F (36.9 C) (Oral)   Resp 17   SpO2 99%   Visual Acuity Right Eye Distance:   Left Eye Distance:   Bilateral Distance:    Right Eye Near:   Left Eye Near:    Bilateral Near:     Physical Exam Constitutional:      Appearance: Normal appearance.   Cardiovascular:     Rate and Rhythm: Normal rate and regular rhythm.  Pulmonary:     Effort: Pulmonary effort is normal.     Breath sounds: Normal breath sounds.  Musculoskeletal:     Right shoulder: Tenderness present. Decreased range of motion.     Right upper arm: Swelling present.     Right wrist: Tenderness present. No swelling. Decreased range of motion.     Comments: Ecchymosis on posterior humerus  Skin:    Capillary Refill: Capillary refill takes less than 2 seconds.  Neurological:     General: No focal deficit present.     Mental Status: She is alert and oriented to person, place, and time.     UC Treatments / Results  Labs (all labs ordered are listed, but only abnormal results are displayed) Labs Reviewed - No data to display  EKG   Radiology DG Ribs Unilateral W/Chest Right  Result Date: 07/24/2021 CLINICAL DATA:  Right lateral chest pain after falling 1 day ago. EXAM: RIGHT RIBS AND CHEST - 3+ VIEW COMPARISON:  Prior relevant studies for this patient are unavailable, including right rib series done 02/15/2020. FINDINGS: The heart size and mediastinal contours are normal. The lungs appear clear. There is no pleural effusion or pneumothorax. No evidence of acute right-sided rib fracture or focal rib lesion. A metallic BB was placed over the area of pain inferolaterally. IMPRESSION: No evidence of acute right-sided rib fracture, pleural effusion or pneumothorax. Prior relevant studies unavailable. Electronically Signed   By: Richardean Sale M.D.   On: 07/24/2021 11:32   DG Shoulder Right  Result Date: 07/24/2021 CLINICAL DATA:  Fall. EXAM: RIGHT SHOULDER - 2+ VIEW COMPARISON:  None. FINDINGS: There is no evidence of fracture or dislocation. There is no evidence of arthropathy or other focal bone abnormality. Soft tissues are unremarkable. IMPRESSION: Negative. Electronically Signed   By: Kerby Moors M.D.   On: 07/24/2021 11:29   DG Hand Complete Right  Result  Date: 07/24/2021 CLINICAL DATA:  Fall with wrist injury yesterday. EXAM: RIGHT HAND - COMPLETE 3+ VIEW COMPARISON:  None. FINDINGS: The mineralization and alignment are normal. There is no evidence of acute fracture or dislocation. The joint spaces appear preserved. There is a thumb ring. No other foreign body or focal soft tissue abnormality identified. IMPRESSION: No acute right hand findings identified. Electronically Signed   By: Richardean Sale M.D.   On: 07/24/2021 11:33  Procedures Procedures (including critical care time)  Medications Ordered in UC Medications - No data to display  Initial Impression / Assessment and Plan / UC Course  I have reviewed the triage vital signs and the nursing notes.  Pertinent labs & imaging results that were available during my care of the patient were reviewed by me and considered in my medical decision making (see chart for details).  Patient presents today with multiple injuries plain film of the right shoulder, right ribs, right wrist and right hand are all negative.  Treating for acute inflammation with prednisone 40 mg once daily for 5 days along with tizanidine for acute pain.  Patient was given a Toradol IM injection here in clinic for pain. RICE recommended Follow-up with PCP if symptoms do not readily improve. Final Clinical Impressions(s) / UC Diagnoses   Final diagnoses:  Fall, initial encounter  Multiple injuries  Shoulder strain, right, initial encounter   Discharge Instructions   None    ED Prescriptions     Medication Sig Dispense Auth. Provider   predniSONE (DELTASONE) 20 MG tablet Take 2 tablets (40 mg total) by mouth daily with breakfast. 10 tablet Scot Jun, FNP   tiZANidine (ZANAFLEX) 4 MG tablet Take 1 tablet (4 mg total) by mouth every 8 (eight) hours as needed for muscle spasms. 30 tablet Scot Jun, FNP      PDMP not reviewed this encounter.   Scot Jun, FNP 07/24/21 1149    Scot Jun, [place] 07/24/21 1151

## 2021-08-25 ENCOUNTER — Telehealth: Payer: Medicaid Other | Admitting: Nurse Practitioner

## 2021-08-25 DIAGNOSIS — U071 COVID-19: Secondary | ICD-10-CM

## 2021-08-26 MED ORDER — BENZONATATE 100 MG PO CAPS: 100.0000 mg | ORAL_CAPSULE | Freq: Three times a day (TID) | ORAL | 0 refills | Status: DC | PRN

## 2021-08-26 MED ORDER — NAPROXEN 500 MG PO TABS: 500.0000 mg | ORAL_TABLET | Freq: Two times a day (BID) | ORAL | 0 refills | Status: DC

## 2021-08-26 NOTE — Progress Notes (Signed)
    Sorry but we cannot treat someone in someone elses chart

## 2021-08-28 ENCOUNTER — Other Ambulatory Visit: Payer: Self-pay | Admitting: Physician Assistant

## 2021-08-28 DIAGNOSIS — J4540 Moderate persistent asthma, uncomplicated: Secondary | ICD-10-CM

## 2021-08-30 ENCOUNTER — Other Ambulatory Visit: Payer: Self-pay | Admitting: Physician Assistant

## 2021-08-30 DIAGNOSIS — Z20822 Contact with and (suspected) exposure to covid-19: Secondary | ICD-10-CM

## 2021-09-01 ENCOUNTER — Telehealth: Payer: Medicaid Other | Admitting: Physician Assistant

## 2021-09-01 NOTE — Progress Notes (Signed)
Not patient

## 2021-09-15 ENCOUNTER — Telehealth: Payer: Medicaid Other | Admitting: Nurse Practitioner

## 2021-09-15 DIAGNOSIS — R590 Localized enlarged lymph nodes: Secondary | ICD-10-CM

## 2021-09-15 MED ORDER — NAPROXEN 500 MG PO TABS: 500.0000 mg | ORAL_TABLET | Freq: Two times a day (BID) | ORAL | 0 refills | Status: DC

## 2021-09-15 NOTE — Progress Notes (Signed)
Virtual Visit Consent   Jill Fields, you are scheduled for a virtual visit with Mary-Margaret Hassell Done, [place], a [facility] provider, today.     Just as with appointments in the office, your consent must be obtained to participate.  Your consent will be active for this visit and any virtual visit you may have with one of our providers in the next 365 days.     If you have a MyChart account, a copy of this consent can be sent to you electronically.  All virtual visits are billed to your insurance company just like a traditional visit in the office.    As this is a virtual visit, video technology does not allow for your provider to perform a traditional examination.  This may limit your provider's ability to fully assess your condition.  If your provider identifies any concerns that need to be evaluated in person or the need to arrange testing (such as labs, EKG, etc.), we will make arrangements to do so.     Although advances in technology are sophisticated, we cannot ensure that it will always work on either your end or our end.  If the connection with a video visit is poor, the visit may have to be switched to a telephone visit.  With either a video or telephone visit, we are not always able to ensure that we have a secure connection.     I need to obtain your verbal consent now.   Are you willing to proceed with your visit today? YES   Kemani Heidel has provided verbal consent on 09/15/2021 for a virtual visit (video or telephone).   Mary-Margaret Hassell Done, FNP   Date: 09/15/2021 6:59 PM   Virtual Visit via Video Note   I, Mary-Margaret Hassell Done, connected with Jill Fields ([id number], [birth year]) on 09/15/21 at  7:00 PM EDT by a video-enabled telemedicine application and verified that I am speaking with the correct person using two identifiers.  Location: Patient: Virtual Visit Location Patient: Home Provider: Virtual Visit Location Provider: Mobile   I discussed the limitations of  evaluation and management by telemedicine and the availability of in person appointments. The patient expressed understanding and agreed to proceed.    History of Present Illness: Jill Fields is a 35 y.o. who identifies as a female who was assigned female at birth, and is being seen today for jaw pain.  HPI: Patient c/o left jaw pain with swelling. Pain radiate up into left ear. This started yesterday. Denies any pain chewing. No fever no sore throat.   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   Endometriosis determined by laparoscopy 06/04/2019    Allergies:  Allergies  Allergen Reactions   Bee Venom Anaphylaxis   Cat Hair Extract Anaphylaxis   Peanut Oil Anaphylaxis   Ambien [Zolpidem]    Latex    Medications:  Current Outpatient Medications:    albuterol (VENTOLIN HFA) 108 (90 Base) MCG/ACT inhaler, Inhale 2 puffs into the lungs every 6 (six) hours as needed for wheezing or shortness of breath., Disp: 8 g, Rfl: 0   benzonatate (TESSALON PERLES) 100 MG capsule, Take 1 capsule (100 mg total) by mouth 3 (three) times daily as needed., Disp: 20 capsule, Rfl: 0   brompheniramine-pseudoephedrine-DM 30-2-10 MG/5ML syrup, Take 5 mLs by mouth 4 (four) times daily as needed., Disp: 120 mL, Rfl: 0   clonazePAM (KLONOPIN) 1 MG tablet, Take 1 mg by mouth  2 (two) times daily., Disp: , Rfl:    metoCLOPramide (REGLAN) 10 MG tablet, Take 1 tablet (10 mg total) by mouth every 8 (eight) hours as needed for nausea or vomiting., Disp: 30 tablet, Rfl: 0   naproxen (NAPROSYN) 500 MG tablet, Take 1 tablet (500 mg total) by mouth 2 (two) times daily with a meal., Disp: 30 tablet, Rfl: 0   ondansetron (ZOFRAN) 4 MG tablet, Take 1 tablet (4 mg total) by mouth every 8 (eight) hours as needed for nausea or vomiting., Disp: 20 tablet, Rfl: 0   predniSONE (DELTASONE) 20 MG tablet, Take 2 tablets (40 mg total) by mouth daily with breakfast., Disp:  10 tablet, Rfl: 0   tiZANidine (ZANAFLEX) 4 MG tablet, Take 1 tablet (4 mg total) by mouth every 8 (eight) hours as needed for muscle spasms., Disp: 30 tablet, Rfl: 0  Observations/Objective: Patient is well-developed, well-nourished in no acute distress.  Resting comfortably  at home.  Head is normocephalic, atraumatic.  No labored breathing.  Speech is clear and coherent with logical content.  Patient is alert and oriented at baseline.  Painet rubbing left sode of neck. N visible abnormalities  Assessment and Plan:  Jill Fields in today with chief complaint of Jaw Pain   1. LAD (lymphadenopathy) of left cervical region Meds ordered this encounter  Medications   naproxen (NAPROSYN) 500 MG tablet    Sig: Take 1 tablet (500 mg total) by mouth 2 (two) times daily with a meal.    Dispense:  30 tablet    Refill:  0    Order Specific Question:   Supervising Provider    Answer:   MILLER, BRIAN [[id number]]   Moist heat  Rest If not improving in 48 hours will need to be seen face to face.    Follow Up Instructions: I discussed the assessment and treatment plan with the patient. The patient was provided an opportunity to ask questions and all were answered. The patient agreed with the plan and demonstrated an understanding of the instructions.  A copy of instructions were sent to the patient via MyChart.  The patient was advised to call back or seek an in-person evaluation if the symptoms worsen or if the condition fails to improve as anticipated.  Time:  I spent 11 minutes with the patient via telehealth technology discussing the above problems/concerns.    Mary-Margaret Hassell Done, FNP  According to the chart naprosyn was filled for patient on 09/01/21. Actually was an erroneous encounter. Patient had actually tried to do encounter for her daughter under her own chart. Provider cancelled visit and meds were not suppose to be filled. Mary-Margaret Hassell Done, FNP

## 2021-09-20 ENCOUNTER — Other Ambulatory Visit: Payer: Self-pay | Admitting: Nurse Practitioner

## 2021-10-14 ENCOUNTER — Other Ambulatory Visit: Payer: Self-pay | Admitting: Nurse Practitioner

## 2021-10-19 ENCOUNTER — Other Ambulatory Visit: Payer: Self-pay | Admitting: Nurse Practitioner

## 2021-10-31 ENCOUNTER — Other Ambulatory Visit: Payer: Self-pay | Admitting: Physician Assistant

## 2021-10-31 DIAGNOSIS — J4540 Moderate persistent asthma, uncomplicated: Secondary | ICD-10-CM

## 2021-12-06 ENCOUNTER — Other Ambulatory Visit: Payer: Self-pay | Admitting: Physician Assistant

## 2021-12-06 ENCOUNTER — Telehealth: Payer: Medicaid Other | Admitting: Physician Assistant

## 2021-12-06 ENCOUNTER — Telehealth: Payer: Medicaid Other | Admitting: Family

## 2021-12-06 DIAGNOSIS — J209 Acute bronchitis, unspecified: Secondary | ICD-10-CM | POA: Diagnosis not present

## 2021-12-06 DIAGNOSIS — Z20822 Contact with and (suspected) exposure to covid-19: Secondary | ICD-10-CM

## 2021-12-06 DIAGNOSIS — A084 Viral intestinal infection, unspecified: Secondary | ICD-10-CM | POA: Diagnosis not present

## 2021-12-06 MED ORDER — ALBUTEROL SULFATE HFA 108 (90 BASE) MCG/ACT IN AERS: 2.0000 | INHALATION_SPRAY | Freq: Four times a day (QID) | RESPIRATORY_TRACT | 0 refills | Status: AC | PRN

## 2021-12-06 MED ORDER — ONDANSETRON HCL 4 MG PO TABS: 4.0000 mg | ORAL_TABLET | Freq: Three times a day (TID) | ORAL | 0 refills | Status: DC | PRN

## 2021-12-06 NOTE — Progress Notes (Signed)
Virtual Visit Consent   Jill Fields, you are scheduled for a virtual visit with a [facility] provider today.     Just as with appointments in the office, your consent must be obtained to participate.  Your consent will be active for this visit and any virtual visit you may have with one of our providers in the next 365 days.     If you have a MyChart account, a copy of this consent can be sent to you electronically.  All virtual visits are billed to your insurance company just like a traditional visit in the office.    As this is a virtual visit, video technology does not allow for your provider to perform a traditional examination.  This may limit your provider's ability to fully assess your condition.  If your provider identifies any concerns that need to be evaluated in person or the need to arrange testing (such as labs, EKG, etc.), we will make arrangements to do so.     Although advances in technology are sophisticated, we cannot ensure that it will always work on either your end or our end.  If the connection with a video visit is poor, the visit may have to be switched to a telephone visit.  With either a video or telephone visit, we are not always able to ensure that we have a secure connection.     I need to obtain your verbal consent now.   Are you willing to proceed with your visit today?    Jill Fields has provided verbal consent on 12/06/2021 for a virtual visit (video or telephone).   Jill Dun, FNP   Date: 12/06/2021 4:08 PM   Virtual Visit via Video Note   I, Jill Fields, connected with  Jill Fields  ([id number], [birth year]) on 12/06/21 at  4:00 PM EST by a video-enabled telemedicine application and verified that I am speaking with the correct person using two identifiers.  Location: Patient: Virtual Visit Location Patient: Home Provider: Virtual Visit Location Provider: Home Office   I discussed the limitations of evaluation and management by telemedicine  and the availability of in person appointments. The patient expressed understanding and agreed to proceed.    History of Present Illness: Jill Fields is a 36 y.o. who identifies as a female who was assigned female at birth, and is being seen today for congestion, nausea, and vomiting.   HPI: Cough This is a new problem. The current episode started in the past 7 days. The problem has been gradually worsening. The problem occurs every few minutes. The cough is Productive of sputum. Associated symptoms include chills, a fever, headaches, myalgias, nasal congestion, postnasal drip, rhinorrhea and shortness of breath. Risk factors for lung disease include smoking/tobacco exposure.  Emesis  This is a new problem. The current episode started yesterday. The problem occurs more than 10 times per day. Associated symptoms include chills, coughing, diarrhea, a fever, headaches and myalgias. She has tried bed rest for the symptoms. The treatment provided mild relief.   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   Endometriosis determined by laparoscopy 06/04/2019    Allergies:  Allergies  Allergen Reactions   Bee Venom Anaphylaxis   Cat Hair Extract Anaphylaxis   Peanut Oil Anaphylaxis   Ambien [Zolpidem]    Latex    Medications:  Current Outpatient Medications:    ondansetron (ZOFRAN) 4 MG tablet, Take 1 tablet (4 mg total)  by mouth every 8 (eight) hours as needed for nausea or vomiting., Disp: 20 tablet, Rfl: 0   albuterol (VENTOLIN HFA) 108 (90 Base) MCG/ACT inhaler, Inhale 2 puffs into the lungs every 6 (six) hours as needed for wheezing or shortness of breath., Disp: 8 g, Rfl: 0   benzonatate (TESSALON PERLES) 100 MG capsule, Take 1 capsule (100 mg total) by mouth 3 (three) times daily as needed., Disp: 20 capsule, Rfl: 0   brompheniramine-pseudoephedrine-DM 30-2-10 MG/5ML syrup, Take 5 mLs by mouth 4 (four) times daily as needed.,  Disp: 120 mL, Rfl: 0   clonazePAM (KLONOPIN) 1 MG tablet, Take 1 mg by mouth 2 (two) times daily., Disp: , Rfl:    naproxen (NAPROSYN) 500 MG tablet, Take 1 tablet (500 mg total) by mouth 2 (two) times daily with a meal., Disp: 30 tablet, Rfl: 0   tiZANidine (ZANAFLEX) 4 MG tablet, Take 1 tablet (4 mg total) by mouth every 8 (eight) hours as needed for muscle spasms., Disp: 30 tablet, Rfl: 0  Observations/Objective: Patient is well-developed, well-nourished in no acute distress.  Head is normocephalic, atraumatic.  No labored breathing.  Speech is clear and coherent with logical content.  Patient is alert and oriented at baseline.  Nasal congestion  Assessment and Plan: 1. Viral gastroenteritis - ondansetron (ZOFRAN) 4 MG tablet; Take 1 tablet (4 mg total) by mouth every 8 (eight) hours as needed for nausea or vomiting.  Dispense: 20 tablet; Refill: 0  2. Acute bronchitis, unspecified organism  Rest Force fluids Zofran as needed Start Mucinex with full glass water Tylenol as needed  Follow up if symptoms worsen or do not improve   Follow Up Instructions: I discussed the assessment and treatment plan with the patient. The patient was provided an opportunity to ask questions and all were answered. The patient agreed with the plan and demonstrated an understanding of the instructions.  A copy of instructions were sent to the patient via MyChart unless otherwise noted below.     The patient was advised to call back or seek an in-person evaluation if the symptoms worsen or if the condition fails to improve as anticipated.  Time:  I spent 8 minutes with the patient via telehealth technology discussing the above problems/concerns.    Jill Dun, FNP

## 2021-12-06 NOTE — Progress Notes (Signed)
Patient scheduled video visit at same time. Will be seen via video today.

## 2021-12-07 ENCOUNTER — Other Ambulatory Visit: Payer: Self-pay | Admitting: Nurse Practitioner

## 2021-12-09 ENCOUNTER — Other Ambulatory Visit: Payer: Self-pay | Admitting: Nurse Practitioner

## 2021-12-14 ENCOUNTER — Telehealth: Payer: Medicaid Other | Admitting: Physician Assistant

## 2021-12-14 DIAGNOSIS — R42 Dizziness and giddiness: Secondary | ICD-10-CM

## 2021-12-14 DIAGNOSIS — H539 Unspecified visual disturbance: Secondary | ICD-10-CM | POA: Diagnosis not present

## 2021-12-14 NOTE — Progress Notes (Signed)
Virtual Visit Consent   Jill Fields, you are scheduled for a virtual visit with a [facility] provider today.     Just as with appointments in the office, your consent must be obtained to participate.  Your consent will be active for this visit and any virtual visit you may have with one of our providers in the next 365 days.     If you have a MyChart account, a copy of this consent can be sent to you electronically.  All virtual visits are billed to your insurance company just like a traditional visit in the office.    As this is a virtual visit, video technology does not allow for your provider to perform a traditional examination.  This may limit your provider's ability to fully assess your condition.  If your provider identifies any concerns that need to be evaluated in person or the need to arrange testing (such as labs, EKG, etc.), we will make arrangements to do so.     Although advances in technology are sophisticated, we cannot ensure that it will always work on either your end or our end.  If the connection with a video visit is poor, the visit may have to be switched to a telephone visit.  With either a video or telephone visit, we are not always able to ensure that we have a secure connection.     I need to obtain your verbal consent now.   Are you willing to proceed with your visit today?    Gaby Harney has provided verbal consent on 12/14/2021 for a virtual visit (video or telephone).   [name], [place]   Date: 12/14/2021 5:58 PM   Virtual Visit via Video Note   I, [name], connected with  Jill Fields  ([id number], 1986-01-08) on 12/14/21 at  5:45 PM EST by a video-enabled telemedicine application and verified that I am speaking with the correct person using two identifiers.  Location: Patient: Virtual Visit Location Patient: Home Provider: Virtual Visit Location Provider: Home Office   I discussed the limitations of evaluation and management by  telemedicine and the availability of in person appointments. The patient expressed understanding and agreed to proceed.    History of Present Illness: Jill Fields is a 36 y.o. who identifies as a female who was assigned female at birth, and is being seen today for dizziness/lightheadedness and nausea starting last night, continuing into today with photophobia, bilateral headache -- occipital, and now nausea with vision changes. Has history of migraine but notes this is more severe than her typical migraine. Denies fever, chills, vomiting. Denies LOC. Was told earlier to seek in-person evaluation but wanted to wait for her virtual visit.   HPI: HPI  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   Endometriosis determined by laparoscopy 06/04/2019    Allergies:  Allergies  Allergen Reactions   Bee Venom Anaphylaxis   Cat Hair Extract Anaphylaxis   Peanut Oil Anaphylaxis   Ambien [Zolpidem]    Latex    Medications:  Current Outpatient Medications:    albuterol (VENTOLIN HFA) 108 (90 Base) MCG/ACT inhaler, Inhale 2 puffs into the lungs every 6 (six) hours as needed for wheezing or shortness of breath., Disp: 8 g, Rfl: 0   clonazePAM (KLONOPIN) 1 MG tablet, Take 1 mg by mouth 2 (two) times daily., Disp: , Rfl:    ondansetron (ZOFRAN) 4 MG tablet, Take 1 tablet (  4 mg total) by mouth every 8 (eight) hours as needed for nausea or vomiting., Disp: 20 tablet, Rfl: 0   tiZANidine (ZANAFLEX) 4 MG tablet, Take 1 tablet (4 mg total) by mouth every 8 (eight) hours as needed for muscle spasms., Disp: 30 tablet, Rfl: 0  Observations/Objective: Patient is well-developed, well-nourished in no acute distress.  Resting comfortably at home.  Head is normocephalic, atraumatic.  No labored breathing. Speech is clear and coherent with logical content.  Patient is alert and oriented at baseline.   Assessment and Plan: 1. Dizziness  2. Vision  changes  Along with what most likely is a more significant migraine but she needs further evaluation giving increased severity compared to baseline and new symptoms for her. She refuses ER but agrees to have someone take her to local UC to begin with for initial evaluation and further recommendations after examining her.   Follow Up Instructions: I discussed the assessment and treatment plan with the patient. The patient was provided an opportunity to ask questions and all were answered. The patient agreed with the plan and demonstrated an understanding of the instructions.  A copy of instructions were sent to the patient via MyChart unless otherwise noted below.    The patient was advised to call back or seek an in-person evaluation if the symptoms worsen or if the condition fails to improve as anticipated.  Time:  I spent 12 minutes with the patient via telehealth technology discussing the above problems/concerns.    [name], PA-C

## 2021-12-18 ENCOUNTER — Other Ambulatory Visit: Payer: Self-pay

## 2021-12-18 ENCOUNTER — Encounter (HOSPITAL_COMMUNITY): Payer: Self-pay

## 2021-12-18 ENCOUNTER — Emergency Department (HOSPITAL_COMMUNITY)
Admission: EM | Admit: 2021-12-18 | Discharge: 2021-12-19 | Disposition: A | Discharge status: Home / Self Care | Payer: Medicaid Other | Attending: Emergency Medicine | Admitting: Emergency Medicine

## 2021-12-18 ENCOUNTER — Ambulatory Visit
Admission: RE | Admit: 2021-12-18 | Discharge: 2021-12-18 | Disposition: A | Discharge status: Home / Self Care | Payer: Medicaid Other | Source: Ambulatory Visit

## 2021-12-18 ENCOUNTER — Emergency Department (HOSPITAL_COMMUNITY): Payer: Medicaid Other

## 2021-12-18 VITALS — BP 119/80 | HR 101 | Temp 98.8°F | Resp 12

## 2021-12-18 DIAGNOSIS — H539 Unspecified visual disturbance: Secondary | ICD-10-CM | POA: Insufficient documentation

## 2021-12-18 DIAGNOSIS — H547 Unspecified visual loss: Secondary | ICD-10-CM | POA: Diagnosis not present

## 2021-12-18 DIAGNOSIS — R110 Nausea: Secondary | ICD-10-CM | POA: Diagnosis not present

## 2021-12-18 DIAGNOSIS — Z7951 Long term (current) use of inhaled steroids: Secondary | ICD-10-CM | POA: Insufficient documentation

## 2021-12-18 DIAGNOSIS — Z91040 Latex allergy status: Secondary | ICD-10-CM | POA: Diagnosis not present

## 2021-12-18 DIAGNOSIS — R519 Headache, unspecified: Secondary | ICD-10-CM | POA: Insufficient documentation

## 2021-12-18 DIAGNOSIS — Z20822 Contact with and (suspected) exposure to covid-19: Secondary | ICD-10-CM | POA: Insufficient documentation

## 2021-12-18 DIAGNOSIS — H53149 Visual discomfort, unspecified: Secondary | ICD-10-CM | POA: Diagnosis not present

## 2021-12-18 DIAGNOSIS — R059 Cough, unspecified: Secondary | ICD-10-CM | POA: Insufficient documentation

## 2021-12-18 DIAGNOSIS — Z8541 Personal history of malignant neoplasm of cervix uteri: Secondary | ICD-10-CM | POA: Diagnosis not present

## 2021-12-18 DIAGNOSIS — Z79899 Other long term (current) drug therapy: Secondary | ICD-10-CM | POA: Diagnosis not present

## 2021-12-18 DIAGNOSIS — Z91010 Allergy to peanuts: Secondary | ICD-10-CM | POA: Diagnosis not present

## 2021-12-18 DIAGNOSIS — R42 Dizziness and giddiness: Secondary | ICD-10-CM | POA: Diagnosis not present

## 2021-12-18 HISTORY — DX: Migraine, unspecified, not intractable, without status migrainosus: G43.909

## 2021-12-18 LAB — CBC WITH DIFFERENTIAL/PLATELET
Abs Immature Granulocytes: 0.03 10*3/uL (ref 0.00–0.07)
Basophils Absolute: 0 10*3/uL (ref 0.0–0.1)
Basophils Relative: 0 %
Eosinophils Absolute: 0 10*3/uL (ref 0.0–0.5)
Eosinophils Relative: 0 %
HCT: 46.7 % — ABNORMAL HIGH (ref 36.0–46.0)
Hemoglobin: 15.6 g/dL — ABNORMAL HIGH (ref 12.0–15.0)
Immature Granulocytes: 0 %
Lymphocytes Relative: 18 %
Lymphs Abs: 2 10*3/uL (ref 0.7–4.0)
MCH: 30.4 pg (ref 26.0–34.0)
MCHC: 33.4 g/dL (ref 30.0–36.0)
MCV: 91 fL (ref 80.0–100.0)
Monocytes Absolute: 0.6 10*3/uL (ref 0.1–1.0)
Monocytes Relative: 5 %
Neutro Abs: 8.2 10*3/uL — ABNORMAL HIGH (ref 1.7–7.7)
Neutrophils Relative %: 77 %
Platelets: 256 10*3/uL (ref 150–400)
RBC: 5.13 MIL/uL — ABNORMAL HIGH (ref 3.87–5.11)
RDW: 12.1 % (ref 11.5–15.5)
WBC: 10.9 10*3/uL — ABNORMAL HIGH (ref 4.0–10.5)
nRBC: 0 % (ref 0.0–0.2)

## 2021-12-18 LAB — BASIC METABOLIC PANEL
Anion gap: 10 (ref 5–15)
BUN: 6 mg/dL (ref 6–20)
CO2: 21 mmol/L — ABNORMAL LOW (ref 22–32)
Calcium: 9 mg/dL (ref 8.9–10.3)
Chloride: 106 mmol/L (ref 98–111)
Creatinine, Ser: 0.6 mg/dL (ref 0.44–1.00)
GFR, Estimated: >60 mL/min (ref >60)
Glucose, Bld: 96 mg/dL (ref 70–99)
Potassium: 3.6 mmol/L (ref 3.5–5.1)
Sodium: 137 mmol/L (ref 135–145)

## 2021-12-18 IMAGING — CT CT HEAD W/O CM
4 series · 17 of 47 positions shown, 19 images · non-contrast
Comparison: None.

CLINICAL DATA: Headache, new or worsening, neuro deficit (Age
18-49y), blurred vision



[Series 3: head without (person_name) · axial · non-contrast · 0.41mm/px · z∈[-134,-14]mm · 7 of 32 slices shown, 9 images]
[im 4/32  brain]
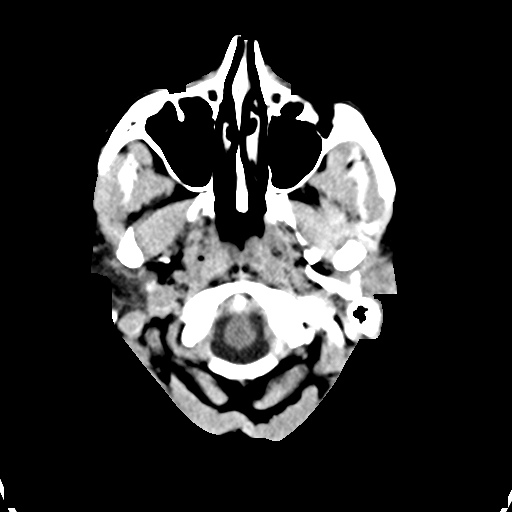
[im 4/32  bone]
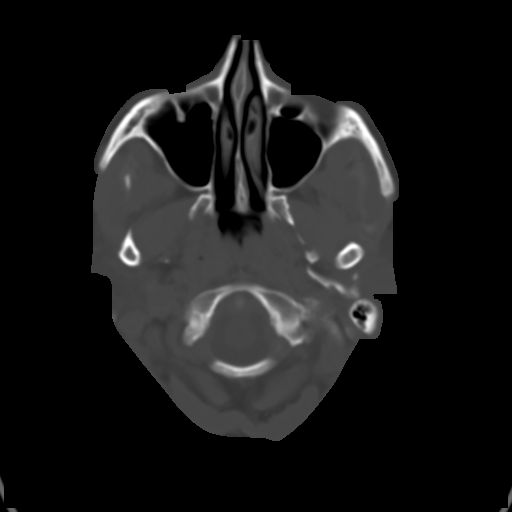
[im 8/32  brain]
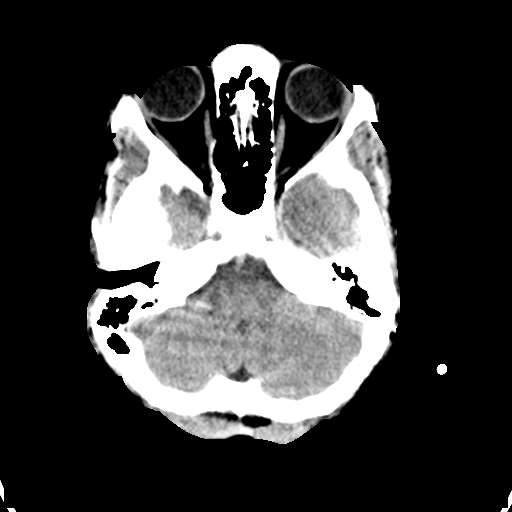
[im 12/32  brain]
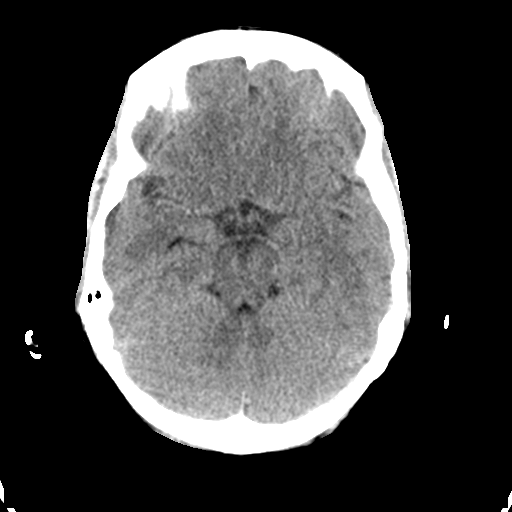
[im 16/32  brain]
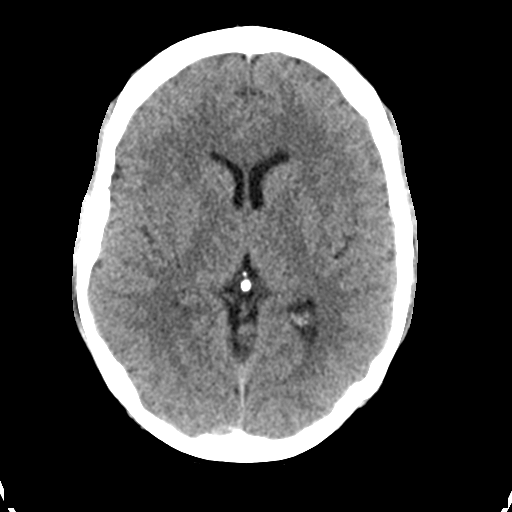
[im 20/32  brain]
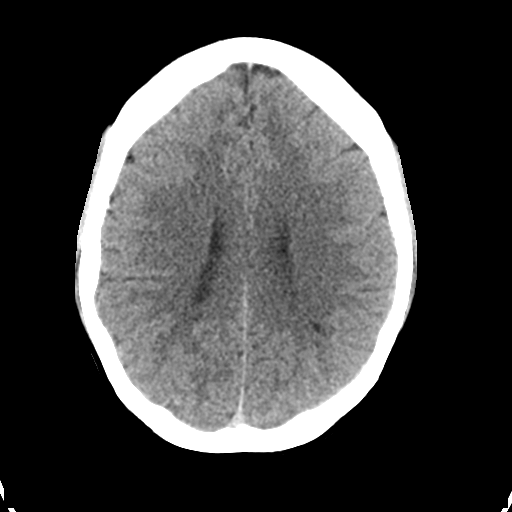
[im 20/32  bone]
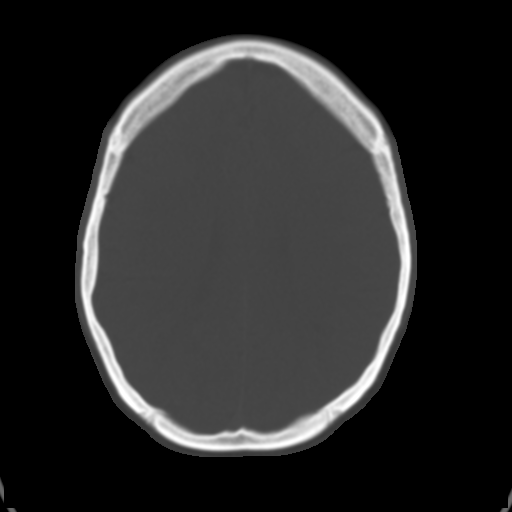
[im 24/32  brain]
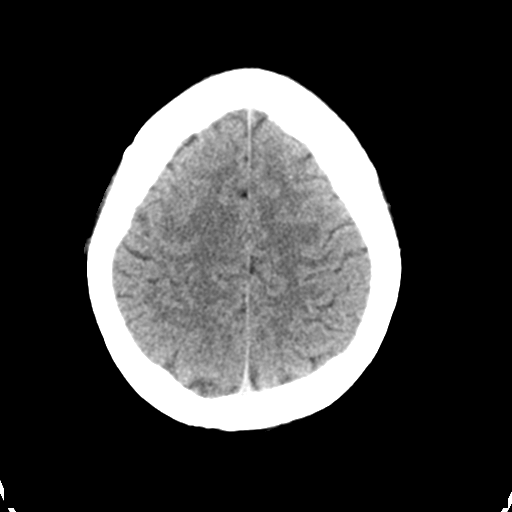
[im 28/32  brain]
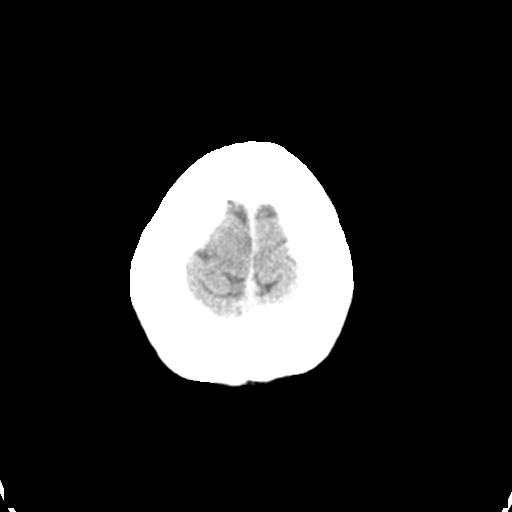

[Series 4: head bone · axial · 0.41mm/px · z∈[-134,-78]mm · 4 of 80 slices shown]
[im 8/80  bone]
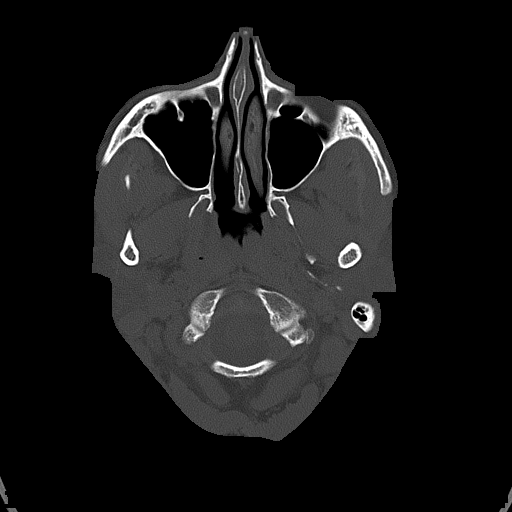
[im 16/80  bone]
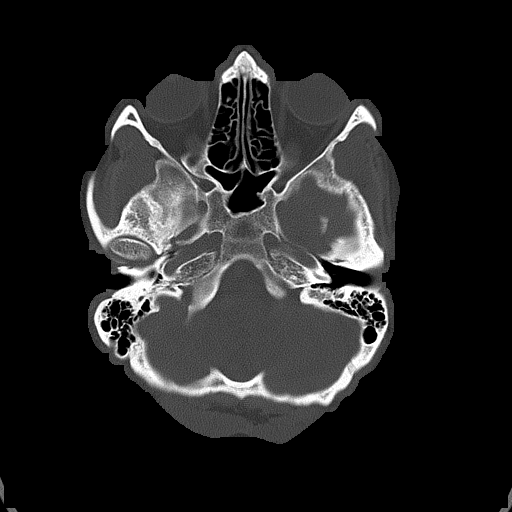
[im 24/80  bone]
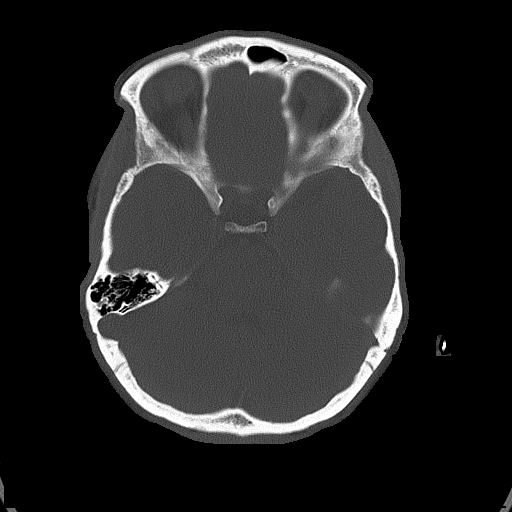
[im 36/80  bone]
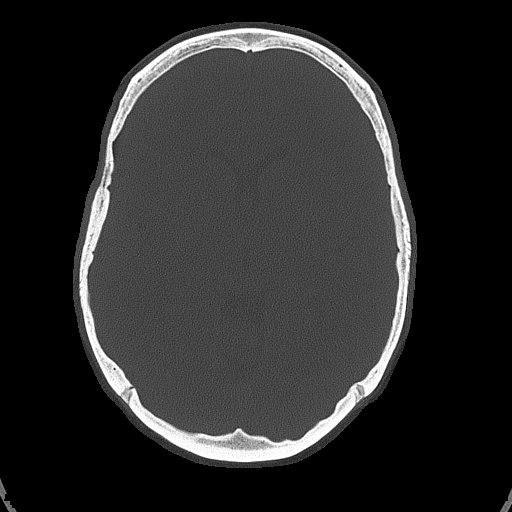

[Series 5: head without cor · coronal · non-contrast · 0.31mm/px · 3 of 67 slices shown]
[im 23/67  brain]
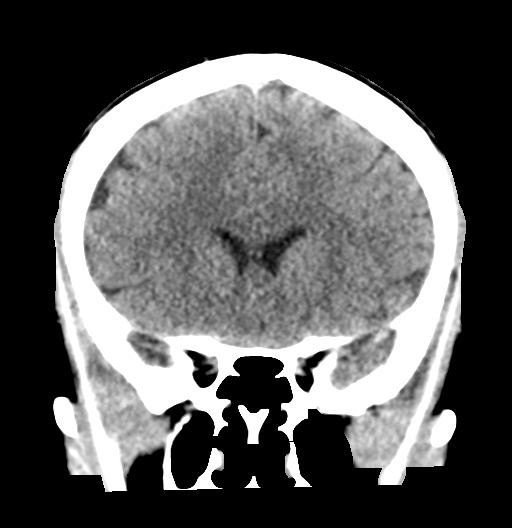
[im 30/67  brain]
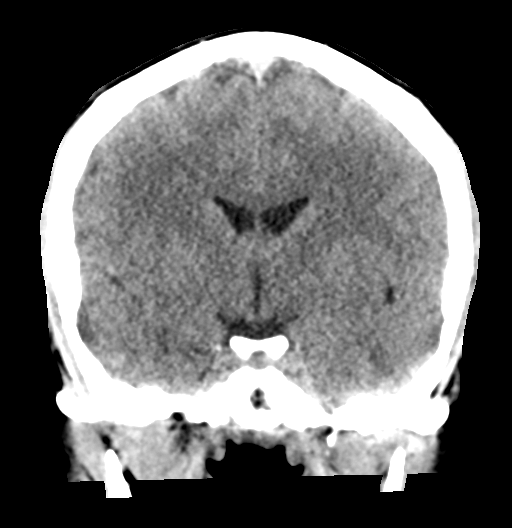
[im 37/67  brain]
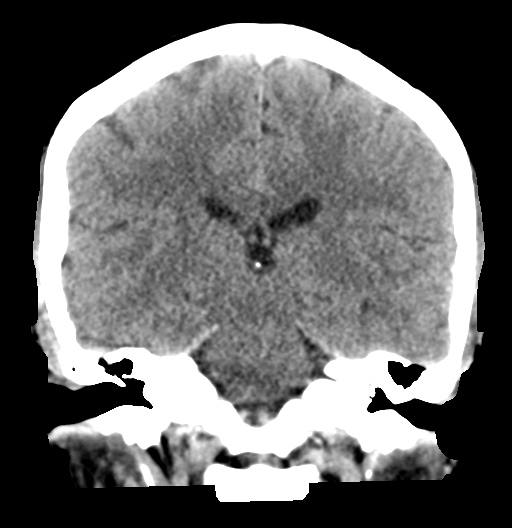

[Series 6: head without sag · sagittal · non-contrast · 0.32mm/px · 3 of 53 slices shown]
[im 18/53  brain]
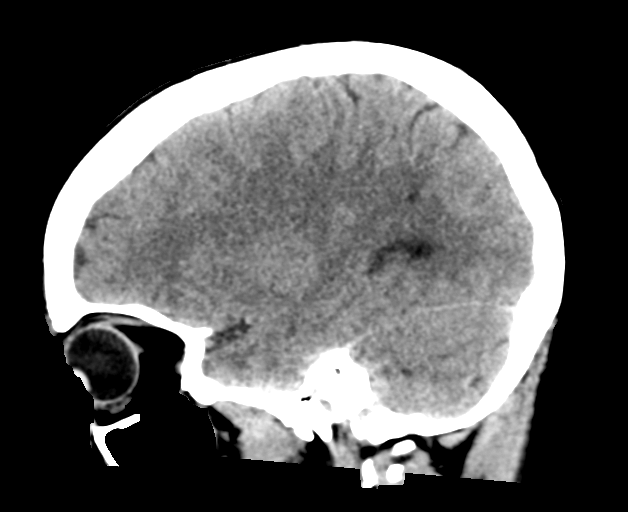
[im 27/53  brain]
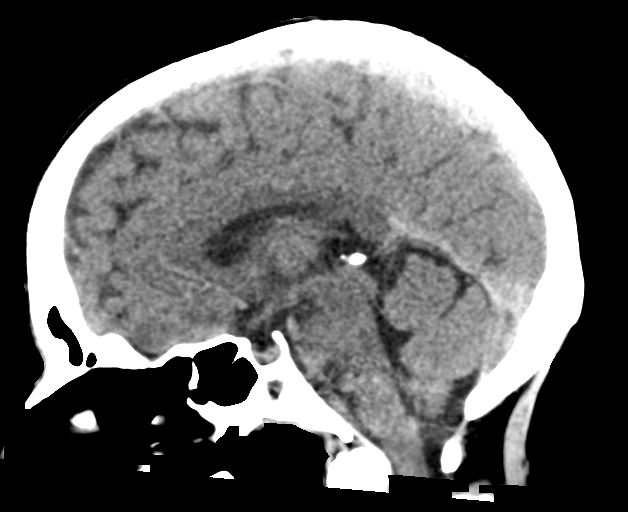
[im 35/53  brain]
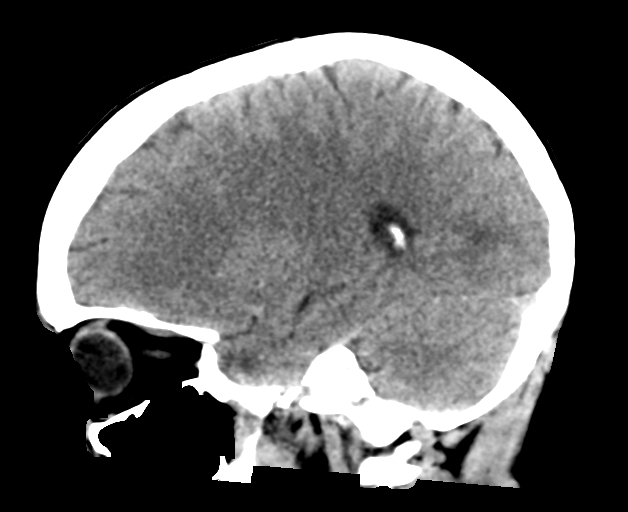

[17 of 47 positions shown; findings below may reference images not displayed]

FINDINGS: Brain: Normal anatomic configuration. No abnormal intra or
extra-axial mass lesion or fluid collection. No abnormal mass effect
or midline shift. No evidence of acute intracranial hemorrhage or
infarct. Ventricular size is normal. Cerebellum unremarkable.

Vascular: Unremarkable

Skull: Intact

Sinuses/Orbits: Paranasal sinuses are clear. Orbits are
unremarkable.

Other: Mastoid air cells and middle ear cavities are clear.
IMPRESSION: No acute intracranial abnormality.  Normal examination

## 2021-12-18 NOTE — ED Notes (Signed)
Pt observed stepping outside

## 2021-12-18 NOTE — ED Notes (Signed)
X1 no response pt seen stepping outside

## 2021-12-18 NOTE — ED Provider Triage Note (Signed)
Emergency Medicine Provider Triage Evaluation Note  Jaxie Racanelli , a 36 y.o. female  was evaluated in triage.  Pt complains of for evaluation of headache vision problems.  Began on Friday.  She feels like she had "bumps" pop up on her head.  They are causing a sharp stabbing pain to her head.  Has history of migraines however states this feels different.  States she sees only black spots to her right eye and blurred vision to her left eye.  No numbness, weakness.  No fever, neck pain.  No history of MS  Review of Systems  Positive: HA, vision changes Negative: Fever neck pain weakness  Physical Exam  BP (!) 128/96 (BP Location: Left Arm)    Pulse (!) 102    Temp 98.1 F (36.7 C) (Oral)    Resp 16    SpO2 98%  Gen:   Awake, no distress   Resp:  Normal effort  MSK:   Moves extremities without difficulty  Neuro:  CN 2-12 grossly intact.  No nystagmus, PERRLA, intact sensation, Other:    Medical Decision Making  Medically screening exam initiated at 10:22 PM.  Appropriate orders placed.  Jimmie Dattilio was informed that the remainder of the evaluation will be completed by another provider, this initial triage assessment does not replace that evaluation, and the importance of remaining in the ED until their evaluation is complete.  Headache, vision changes, has nonfocal neuro exam   Marthena Whitmyer A, PA-C 12/18/21 2223

## 2021-12-18 NOTE — Discharge Instructions (Signed)
Go to the emergency department for further evaluation

## 2021-12-18 NOTE — ED Provider Notes (Signed)
[facility]    CSN: [id number] Arrival date & time: 12/18/21  1744      History   Chief Complaint Chief Complaint  Patient presents with   Headache   Blurred Vision   APPT 1800    HPI Jill Fields is a 36 y.o. female.   Presenting today with 5-day history of progressively worsening sharp stabbing headache, "knots "that have appeared on her scalp that are severely painful, dizziness, photosensitivity of both eyes, blurred vision and now the last day or so loss of vision in the right eye, significantly blurred in the left, nausea from time to time.  She denies any past issues similar to this, no recent falls or head injuries, no new medications or changes to routine otherwise, memory or mental status changes, difficulty breathing, swallowing, speaking, extremity weakness, numbness, tingling.  Had migraines as a child but none similar to this.  Has tried ibuprofen, Zyrtec with no relief.  Did a virtual visit about 4 days ago and was told to be seen in person as soon as possible.  States her symptoms have significantly worsened since this time.    Past Medical History:  Diagnosis Date   Cervical cancer [facility])    Migraine     Patient Active Problem List   Diagnosis Date Noted   Complex cyst of both ovaries 06/04/2019   Endometriosis determined by laparoscopy 06/04/2019    Past Surgical History:  Procedure Laterality Date   ABDOMINAL HYSTERECTOMY     TONSILLECTOMY      OB History   No obstetric history on file.      Home Medications    Prior to Admission medications   Medication Sig Start Date End Date Taking? Authorizing Provider  albuterol (VENTOLIN HFA) 108 (90 Base) MCG/ACT inhaler Inhale 2 puffs into the lungs every 6 (six) hours as needed for wheezing or shortness of breath. 12/06/21   Brunetta Jeans, PA-C  clonazePAM (KLONOPIN) 1 MG tablet Take 1 mg by mouth 2 (two) times daily.    [provider]  ondansetron (ZOFRAN) 4 MG tablet Take 1  tablet (4 mg total) by mouth every 8 (eight) hours as needed for nausea or vomiting. 12/06/21   Evelina Dun A, FNP  tiZANidine (ZANAFLEX) 4 MG tablet Take 1 tablet (4 mg total) by mouth every 8 (eight) hours as needed for muscle spasms. 07/24/21   Scot Jun, FNP  cetirizine (ZYRTEC ALLERGY) 10 MG tablet Take 1 tablet (10 mg total) by mouth daily. 08/30/20 04/06/21  Avegno, Darrelyn Hillock, FNP  fluticasone (FLONASE) 50 MCG/ACT nasal spray Place 1 spray into both nostrils daily for 14 days. 08/30/20 04/06/21  AvegnoDarrelyn Hillock, FNP    Family History Family History  Family history unknown: Yes    Social History Social History   Tobacco Use   Smoking status: Some Days   Smokeless tobacco: Never  Substance Use Topics   Alcohol use: Not Currently   Drug use: Not Currently     Allergies   Bee venom, Cat hair extract, Peanut oil, Ambien [zolpidem], and Latex   Review of Systems Review of Systems Per HPI  Physical Exam Triage Vital Signs ED Triage Vitals [12/18/21 1808]  Enc Vitals Group     BP 119/80     Pulse Rate (!) 101     Resp 12     Temp 98.8 F (37.1 C)     Temp Source Oral     SpO2 96 %  Weight      Height      Head Circumference      Peak Flow      Pain Score 9     Pain Loc      Pain Edu?      Excl. in [place]?    No data found.  Updated Vital Signs BP 119/80 (BP Location: Right Arm)    Pulse (!) 101    Temp 98.8 F (37.1 C) (Oral)    Resp 12    SpO2 96%   Visual Acuity Right Eye Distance:   Left Eye Distance:   Bilateral Distance:    Right Eye Near:   Left Eye Near:    Bilateral Near:      Exam abbreviated as the decision was already made to go to the emergency department for further evaluation given progressive worsening of symptoms and nature of symptoms. Physical Exam Vitals and nursing note reviewed.  Constitutional:      Appearance: Normal appearance. She is not ill-appearing.  HENT:     Head: Atraumatic.     Mouth/Throat:     Mouth:  Mucous membranes are moist.  Eyes:     Extraocular Movements: Extraocular movements intact.     Comments: Significant photosensitivity, worse right eye over left.  Visual acuity not checked today given extent of photosensitivity and difficulty/discomfort opening eyes  Cardiovascular:     Rate and Rhythm: Normal rate and regular rhythm.     Heart sounds: Normal heart sounds.  Pulmonary:     Effort: Pulmonary effort is normal.     Breath sounds: Normal breath sounds.  Musculoskeletal:        General: Normal range of motion.     Cervical back: Normal range of motion and neck supple.  Skin:    General: Skin is warm and dry.  Neurological:     General: No focal deficit present.     Mental Status: She is alert and oriented to person, place, and time.     Motor: No weakness.     Gait: Gait normal.  Psychiatric:        Mood and Affect: Mood normal.        Thought Content: Thought content normal.        Judgment: Judgment normal.     UC Treatments / Results  Labs (all labs ordered are listed, but only abnormal results are displayed) Labs Reviewed - No data to display  EKG   Radiology No results found.  Procedures Procedures (including critical care time)  Medications Ordered in UC Medications - No data to display  Initial Impression / Assessment and Plan / UC Course  I have reviewed the triage vital signs and the nursing notes.  Pertinent labs & imaging results that were available during my care of the patient were reviewed by me and considered in my medical decision making (see chart for details).     Given severity, worsening course of symptoms, lack of resources in the setting for further evaluation and rule out, recommended she go to the emergency department for further evaluation and management.  She is agreeable and has someone with her to transport her via private vehicle.  Declines EMS transport which is reasonable as she is hemodynamically stable for private vehicle  transport.  We will defer any treatments to the emergency department as we are unsure the nature of her symptoms at this time.  Final Clinical Impressions(s) / UC Diagnoses   Final diagnoses:  Loss  of vision  Bad headache  Dizziness  Photophobia     Discharge Instructions      Go to the emergency department for further evaluation    ED Prescriptions   None    PDMP not reviewed this encounter.   Volney American, [place] 12/18/21 1907

## 2021-12-18 NOTE — ED Triage Notes (Signed)
Sent from UC, patient states on Friday she was not feeling herself, headache (behind right eye), and knots appeared on her head, and her vision became blurry. Patient states the since she has had blurry vision and been "in the dark" patient endorses photo sensitivity. Right eye worse than left. Patient also endorses nausea and lightheadedness and forgetfulness.  Hx of migraines, but never felt anything like this. Taking zyrtec and ibuprofen at home for symptoms.

## 2021-12-18 NOTE — ED Triage Notes (Addendum)
Patient c/o headache and blurred vision x 5 days.   Patient denies fall or trauma to head.   Patient endorses pain comes down onto RT eye.   Patient endorses "knots" on head.   Patient endorses photosensitivity. Patient endorses dizziness.   Patient has taken ibuprofen and zyrtec.   History of Migraines.

## 2021-12-18 NOTE — ED Notes (Signed)
Patient is being discharged from the Urgent Care and sent to the Emergency Department via Personal Vehicle w/ driver per patient statement . Per Townsend Roger, patient is in need of higher level of care due to Loss of Vision, Dizziness, and Photophobia. Patient is aware and verbalizes understanding of plan of care.  Vitals:   12/18/21 1808  BP: 119/80  Pulse: (!) 101  Resp: 12  Temp: 98.8 F (37.1 C)  SpO2: 96%

## 2021-12-19 ENCOUNTER — Emergency Department (HOSPITAL_COMMUNITY): Payer: Medicaid Other

## 2021-12-19 ENCOUNTER — Encounter (HOSPITAL_COMMUNITY): Payer: Self-pay | Admitting: Radiology

## 2021-12-19 LAB — RESP PANEL BY RT-PCR (FLU A&B, COVID) ARPGX2
Influenza A by PCR: NEGATIVE
Influenza B by PCR: NEGATIVE
SARS Coronavirus 2 by RT PCR: NEGATIVE

## 2021-12-19 LAB — I-STAT BETA HCG BLOOD, ED (MC, WL, AP ONLY): I-stat hCG, quantitative: <5 m[IU]/mL (ref <5)

## 2021-12-19 IMAGING — CR DG CHEST 2V
2 series · 2 of 2 positions shown · non-contrast
Comparison: [DATE]

CLINICAL DATA: Cough.  Difficulty breathing.

EXAM:
CHEST - 2 VIEW

[chest lat]
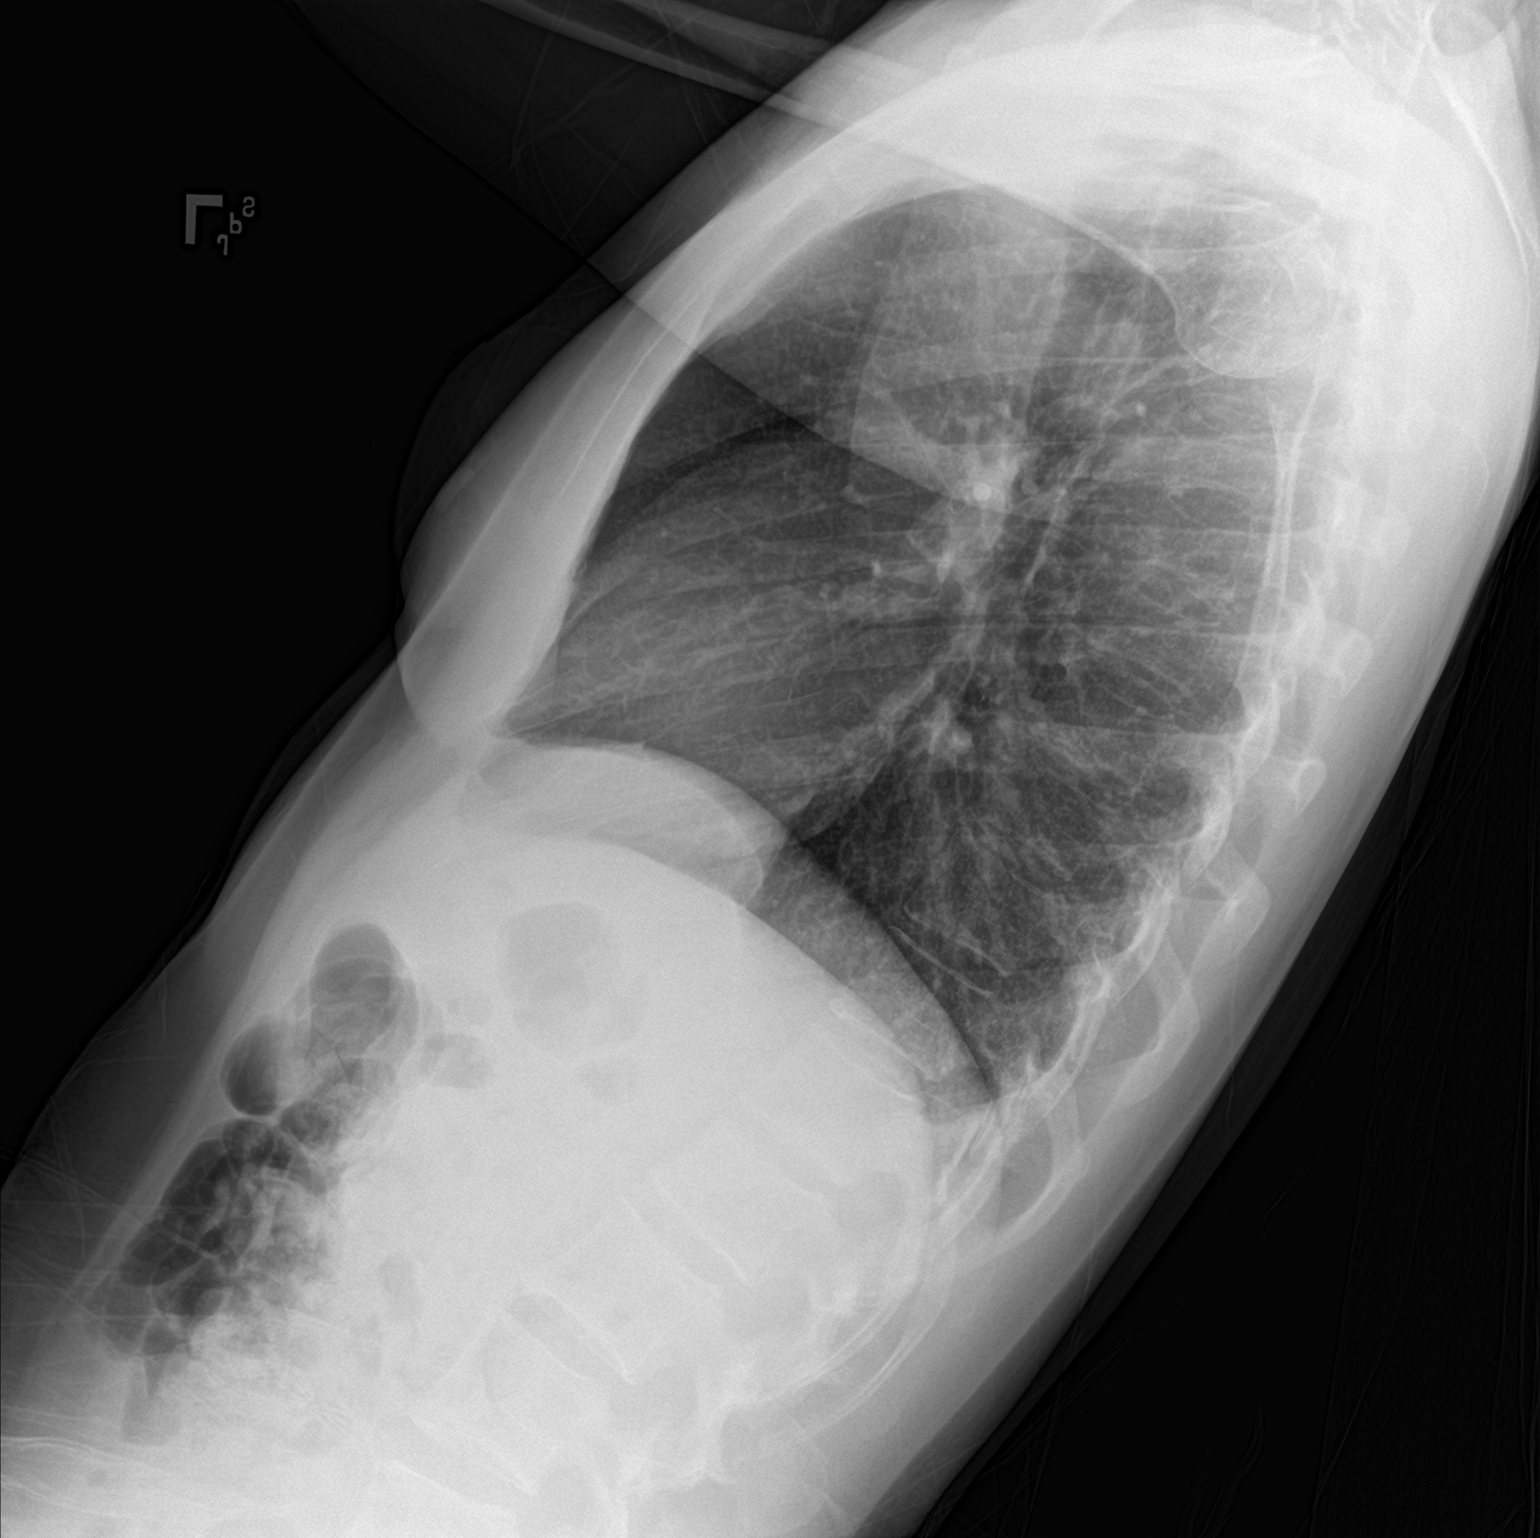

[chest ap]
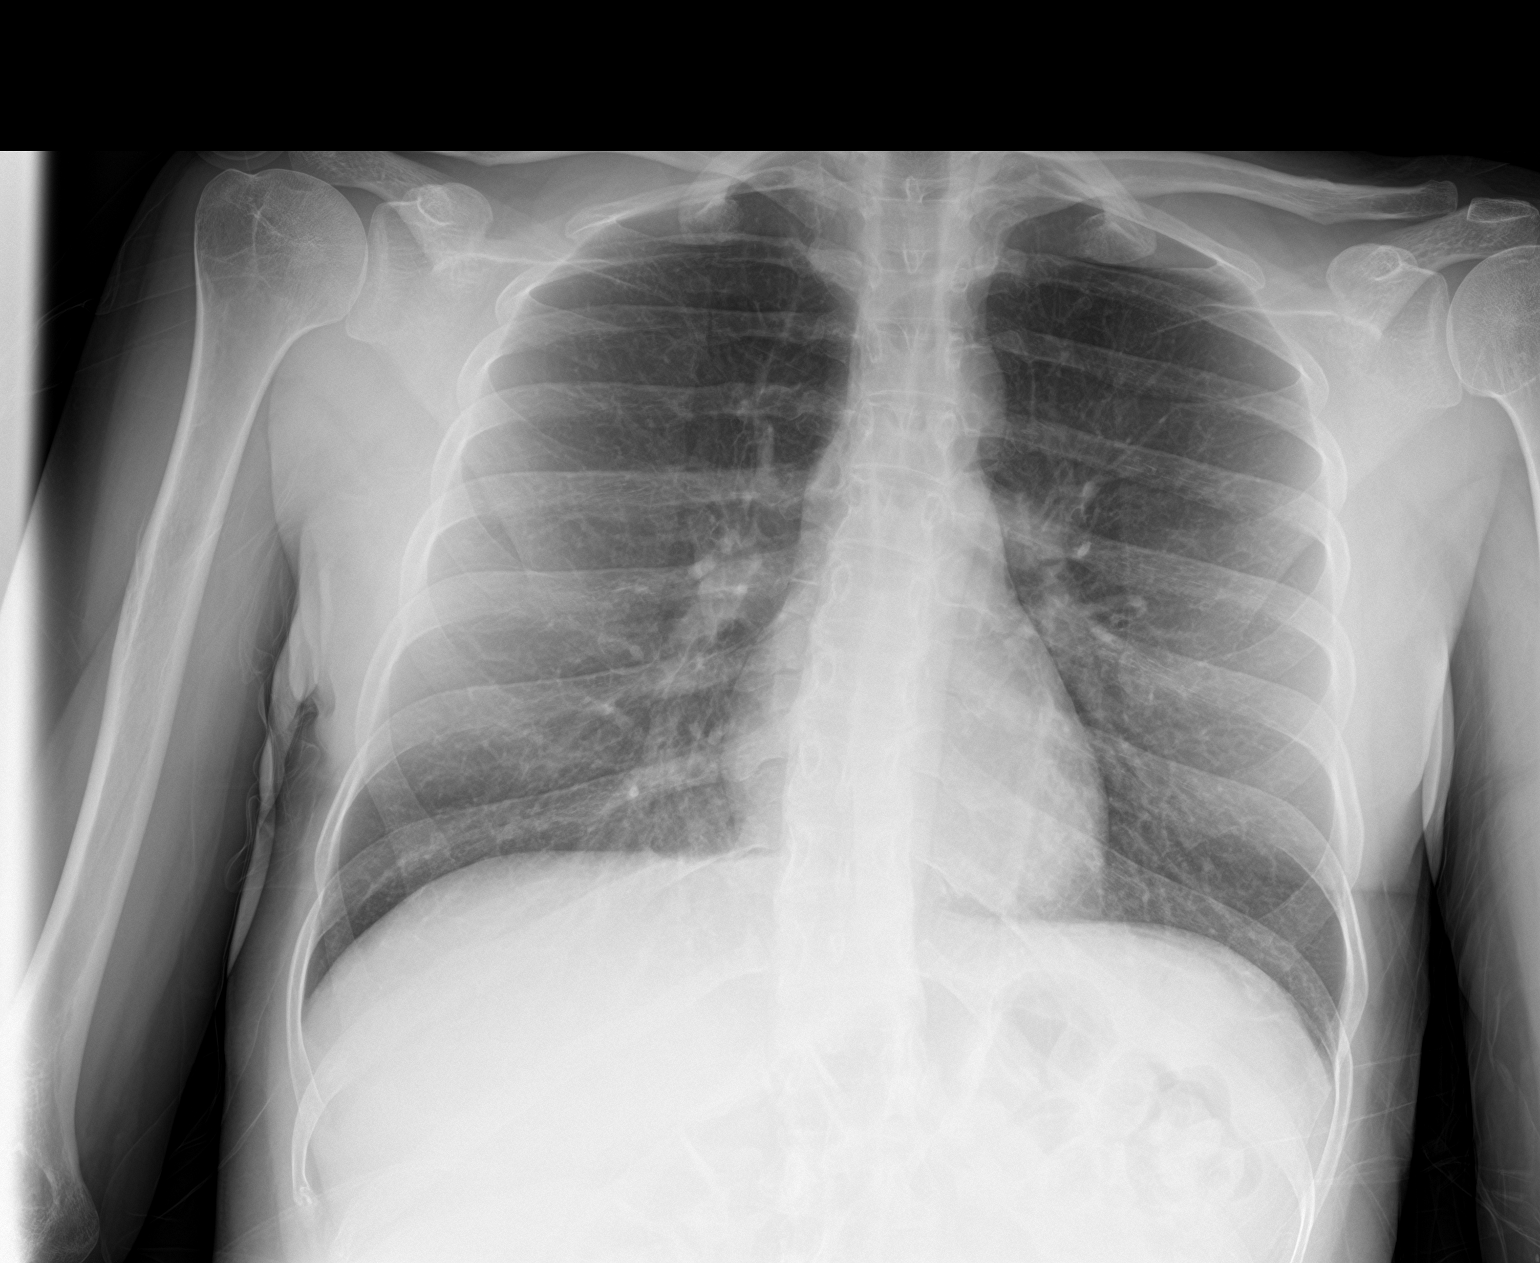

[2 of 2 positions shown; findings below may reference images not displayed]

FINDINGS: The heart size and mediastinal contours are within normal limits.
Both lungs are clear. The visualized skeletal structures are
unremarkable.
IMPRESSION: No active cardiopulmonary disease.

## 2021-12-19 IMAGING — MR MR CERVICAL SPINE WO/W CM
4 of 8 series · 19 of 48 positions shown · IV contrast (Yes   MULTIHANCE)
Comparison: None.

CLINICAL DATA: Right eye vision loss. Headaches. Stabbing headache.

EXAM:
MRI CERVICAL SPINE WITHOUT AND WITH CONTRAST
TECHNIQUE: Multiplanar and multiecho pulse sequences of the cervical spine, to
include the craniocervical junction and cervicothoracic junction,
were obtained without and with intravenous contrast.
CONTRAST:  5mL GADAVIST GADOBUTROL 1 MMOL/ML IV SOLN

[Series 13: T2 · sagittal · 3.0mm · 0.43mm/px · 4 of 14 slices shown (1 of 2)]
[im 1/14]
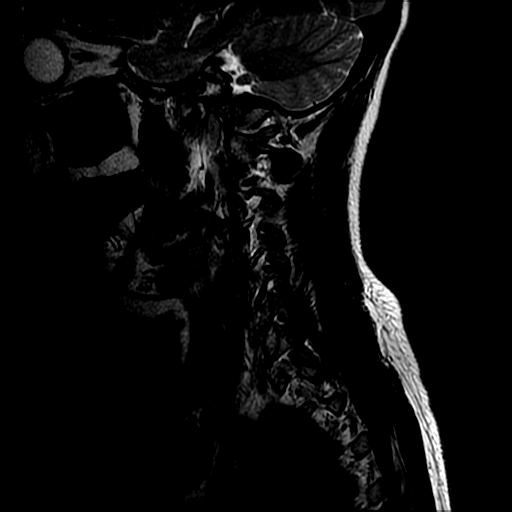
[im 5/14]
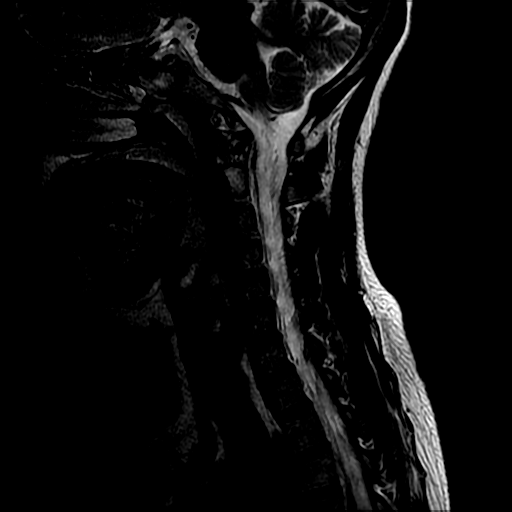
[im 9/14]
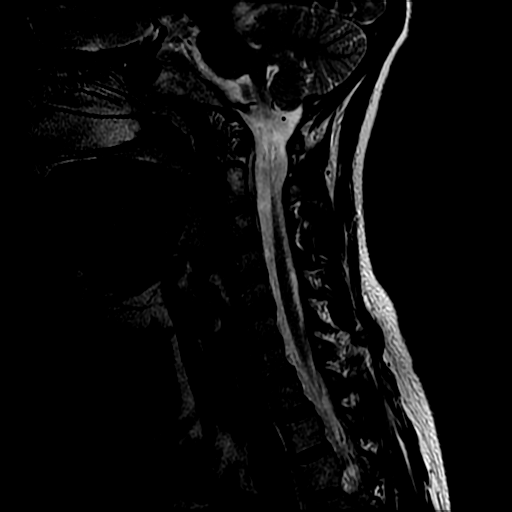
[im 14/14]
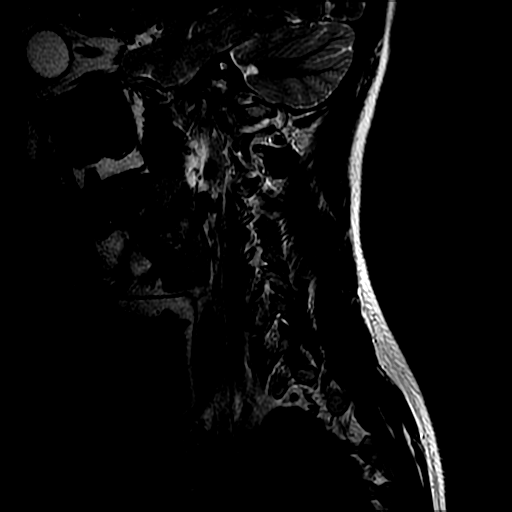

[Series 14: T1 · sagittal · 3.0mm · 0.43mm/px · 4 of 14 slices shown (1 of 2)]
[im 1/14]
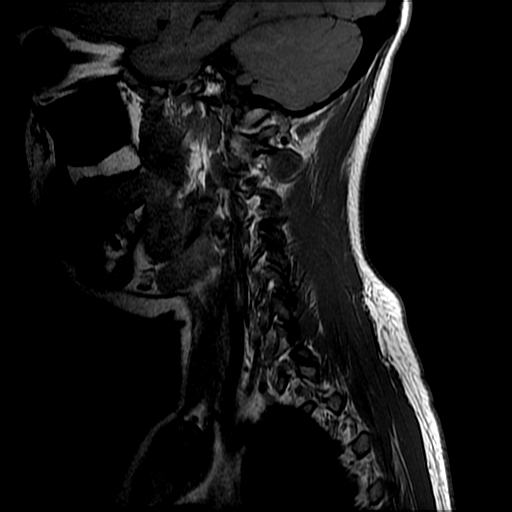
[im 5/14]
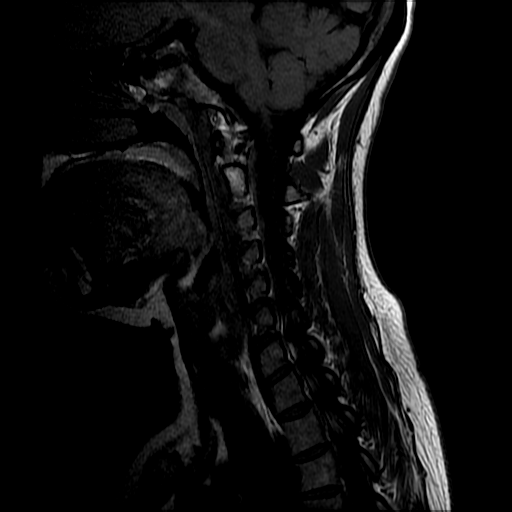
[im 9/14]
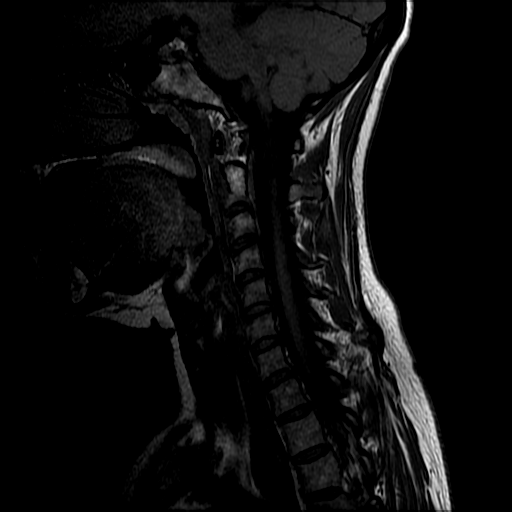
[im 14/14]
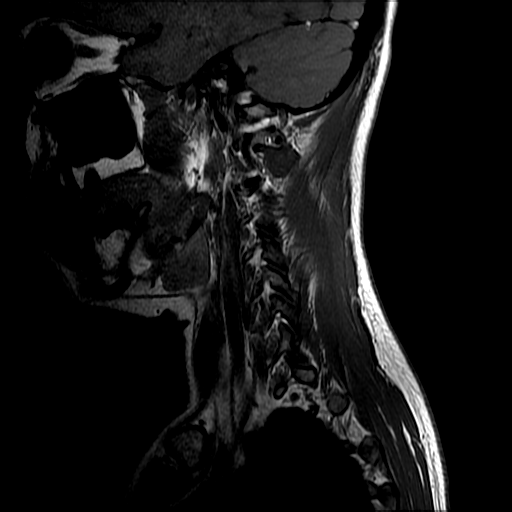

[Series 17: T2 · axial · 3.0mm · 0.39mm/px · z∈[-194,-109]mm · 8 of 27 slices shown (2 of 2)]
[im 1/27]
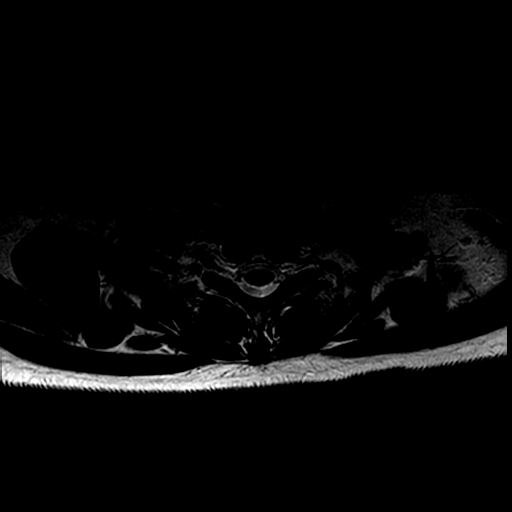
[im 4/27]
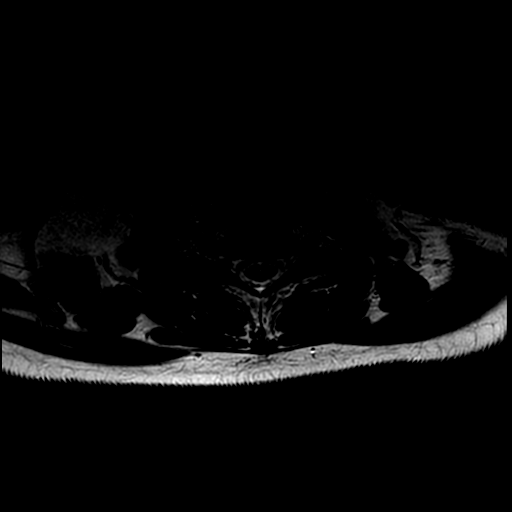
[im 8/27]
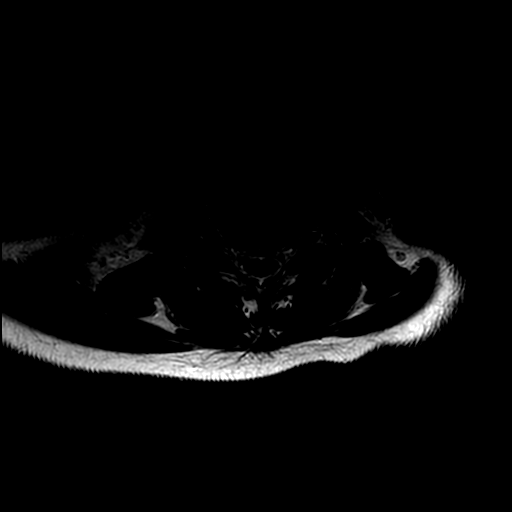
[im 12/27]
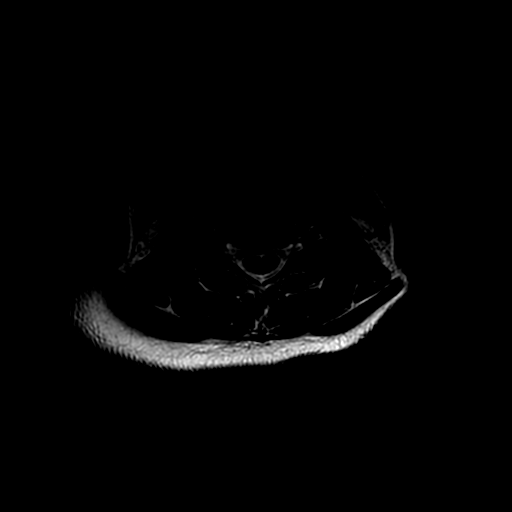
[im 15/27]
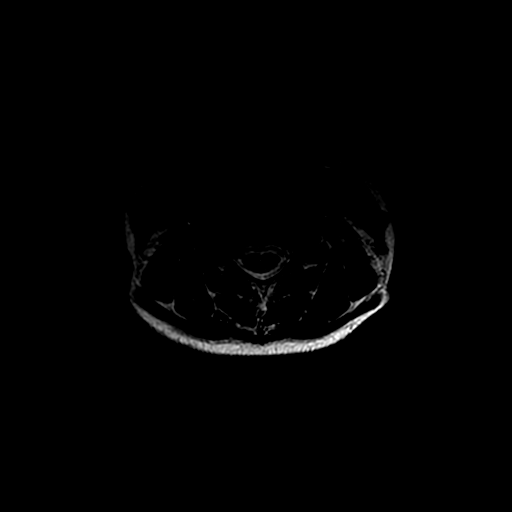
[im 19/27]
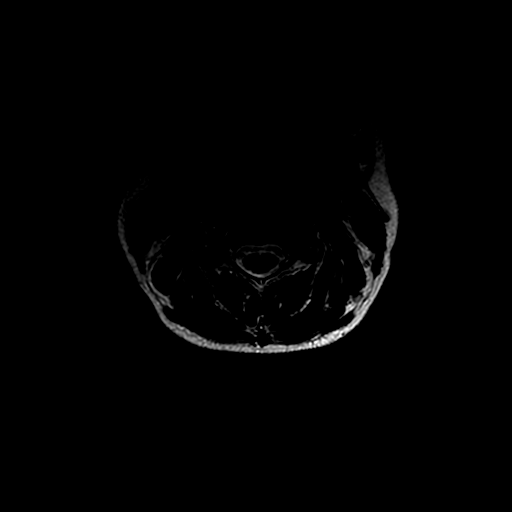
[im 23/27]
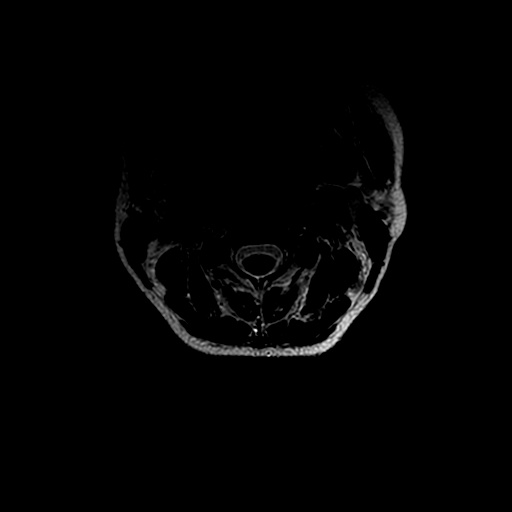
[im 27/27]
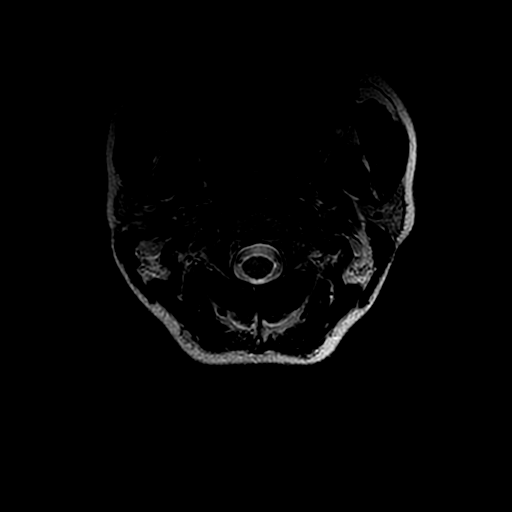

[Series 18: T1 · axial · non-contrast · 3.0mm · 0.39mm/px · z∈[-184,-122]mm · 3 of 27 slices shown (2 of 2)]
[im 4/27]
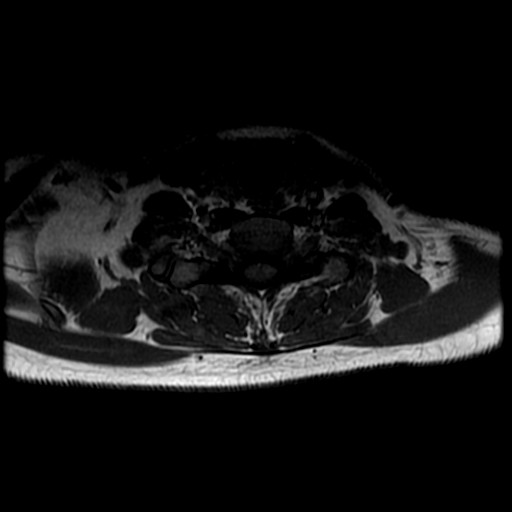
[im 15/27]
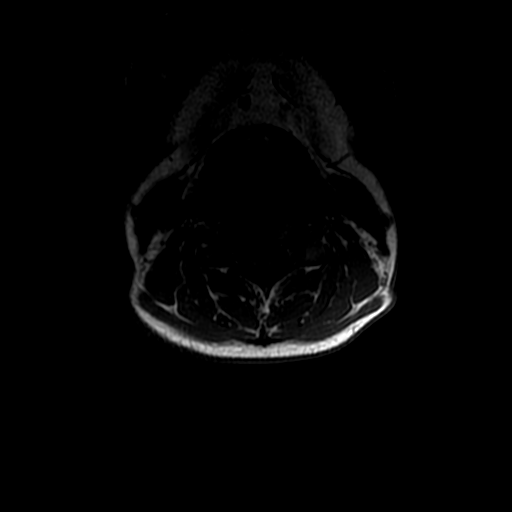
[im 23/27]
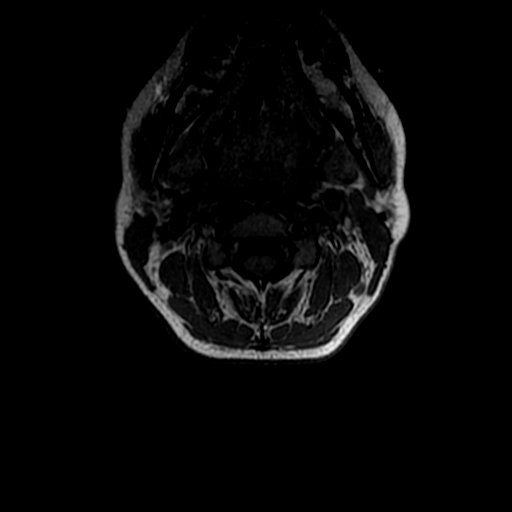

[19 of 48 positions shown; findings below may reference images not displayed]

FINDINGS: Alignment: Physiologic.

Vertebrae: No acute fracture, evidence of discitis, or bone lesion.

Cord: Normal signal and morphology. No areas of abnormal
enhancement.

Posterior Fossa, vertebral arteries, paraspinal tissues: Posterior
fossa demonstrates no focal abnormality. Vertebral artery flow voids
are maintained. Paraspinal soft tissues are unremarkable.

Disc levels:

Discs: Disc spaces are maintained.

C2-3: No significant disc bulge. No neural foraminal stenosis. No
central canal stenosis.

C3-4: No significant disc bulge. No neural foraminal stenosis. No
central canal stenosis.

C4-5: No significant disc bulge. No neural foraminal stenosis. No
central canal stenosis.

C5-6: Mild broad-based disc bulge. No foraminal or central canal
stenosis.

C6-7: No significant disc bulge. No neural foraminal stenosis. No
central canal stenosis.

C7-T1: No significant disc bulge. No neural foraminal stenosis. No
central canal stenosis.
IMPRESSION: 1.  No acute osseous injury of the cervical spine.
2. Minimal broad-based disc bulge at C5-6.
3. No cervical spinal cord abnormality.

## 2021-12-19 IMAGING — MR MR HEAD WO/W CM
9 of 12 series · 35 of 48 positions shown · IV contrast (gadavist)
Comparison: Head CT yesterday

CLINICAL DATA: Monocular vision loss. Headache. Right eye vision
loss. Some blurred vision of the left eye.

EXAM:
MRI HEAD WITHOUT AND WITH CONTRAST
TECHNIQUE: Multiplanar, multiecho pulse sequences of the brain and surrounding
structures were obtained without and with intravenous contrast.
CONTRAST:  5mL GADAVIST GADOBUTROL 1 MMOL/ML IV SOLN

[Series 4: DWI · axial · 3.0mm · 1.09mm/px · z∈[-99,+24]mm · 9 of 88 slices shown (1 of 4)]
[im 1/88]
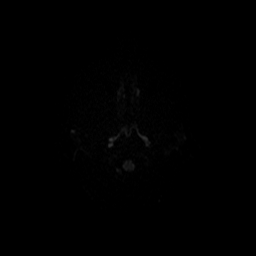
[im 11/88]
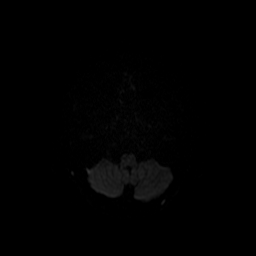
[im 22/88]
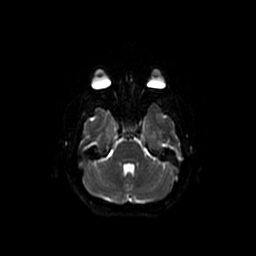
[im 33/88]
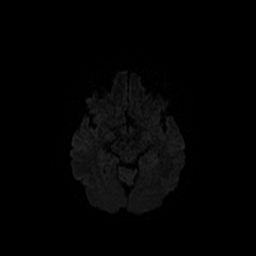
[im 44/88]
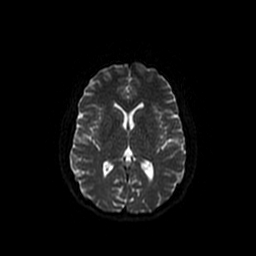
[im 55/88]
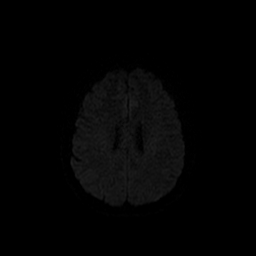
[im 66/88]
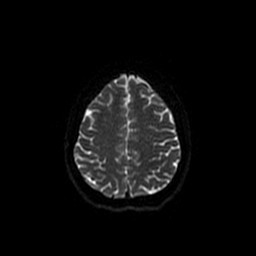
[im 77/88]
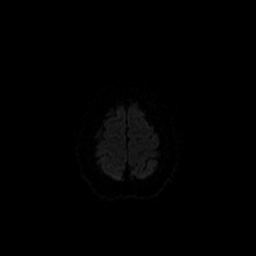
[im 88/88]
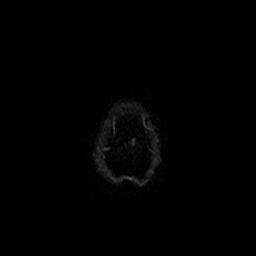

[Series 5: DWI · coronal · 5.0mm · 1.09mm/px · 7 of 68 slices shown (2 of 4)]
[im 1/68]
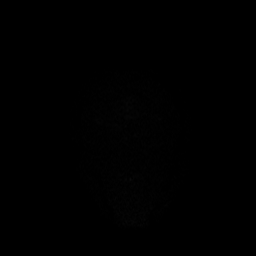
[im 12/68]
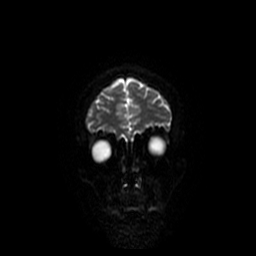
[im 23/68]
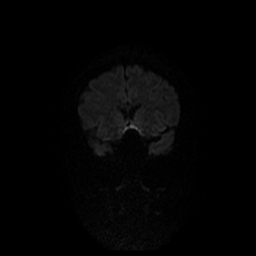
[im 34/68]
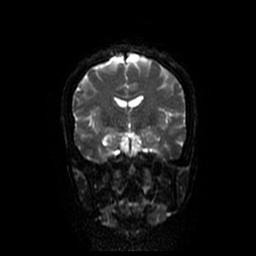
[im 45/68]
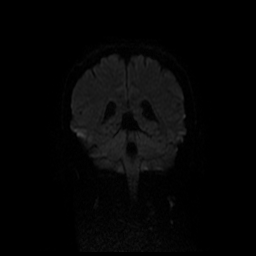
[im 56/68]
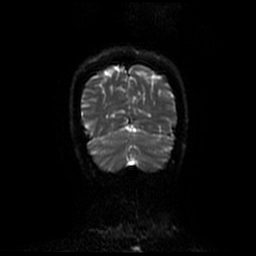
[im 68/68]
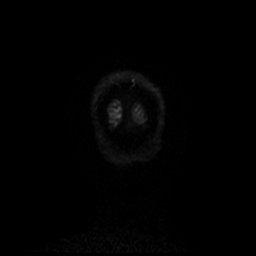

[Series 7: T2 · axial · 5.0mm · 0.43mm/px · z∈[-99,+34]mm · 2 of 24 slices shown (1 of 2)]
[im 1/24]
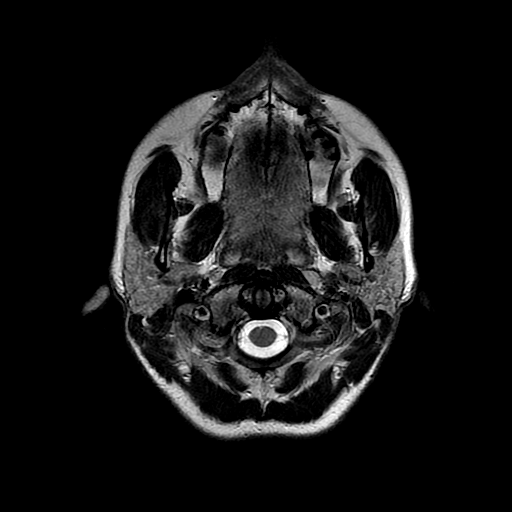
[im 24/24]
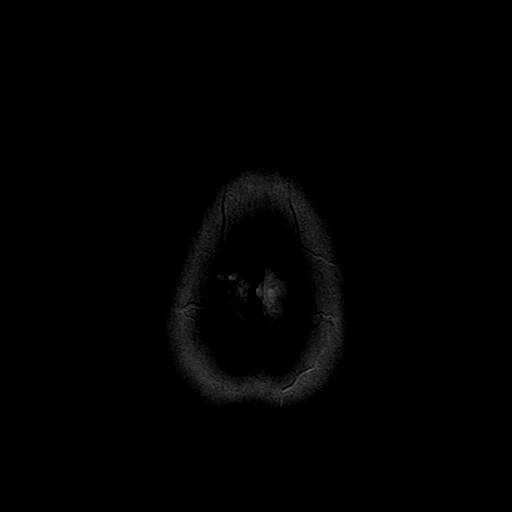

[Series 8: FLAIR · axial · 3.0mm · 0.43mm/px · z∈[-99,+34]mm · 2 of 24 slices shown]
[im 1/24]
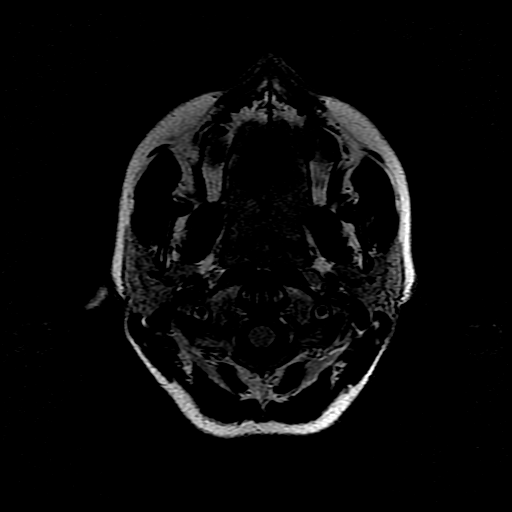
[im 24/24]
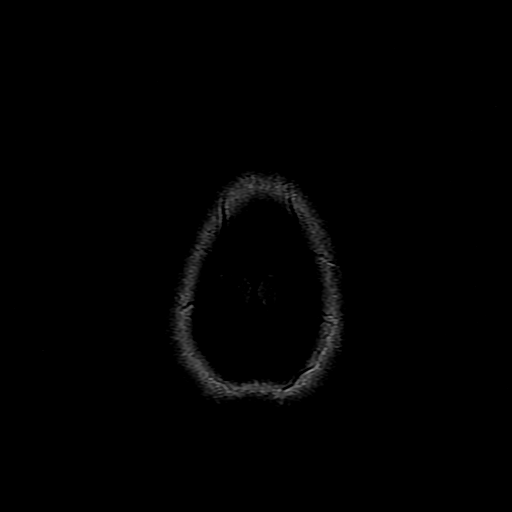

[Series 10: T2 · coronal · 5.0mm · 0.39mm/px · 2 of 26 slices shown (2 of 2)]
[im 1/26]
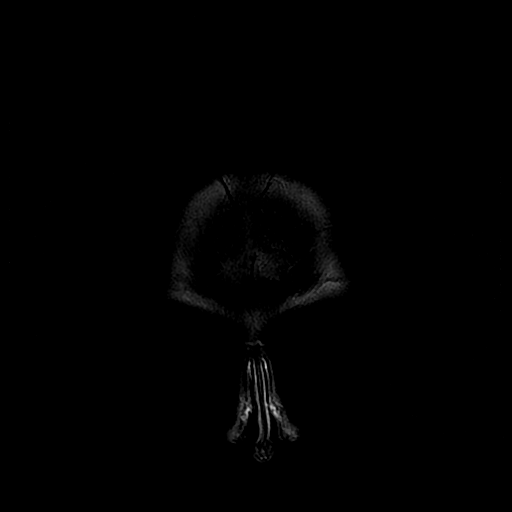
[im 26/26]
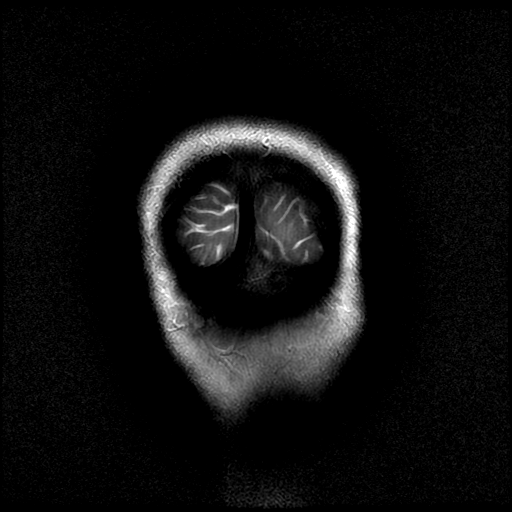

[Series 21: T1 post-contrast · axial · 3.0mm · 0.47mm/px · z∈[-79,+60]mm · 4 of 48 slices shown (1 of 2)]
[im 1/48]
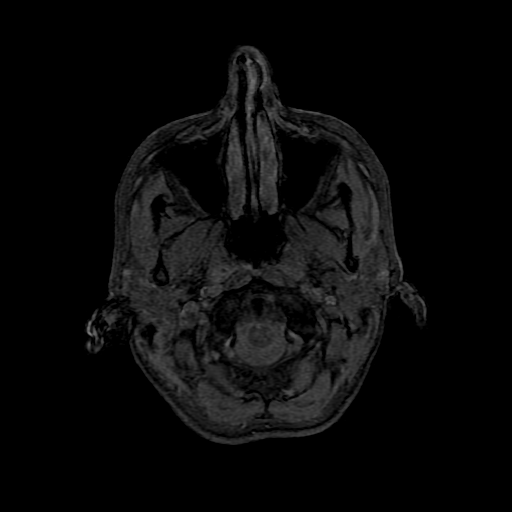
[im 16/48]
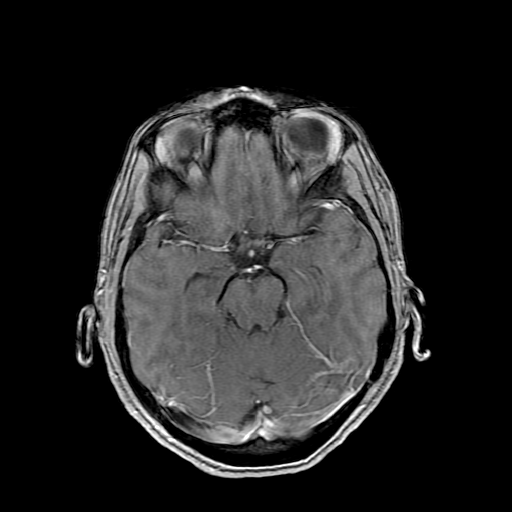
[im 32/48]
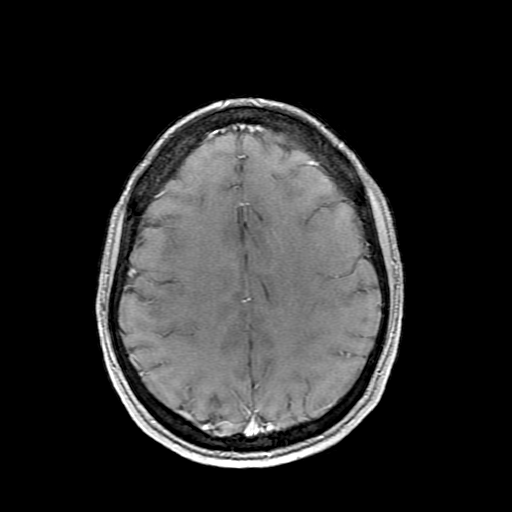
[im 48/48]
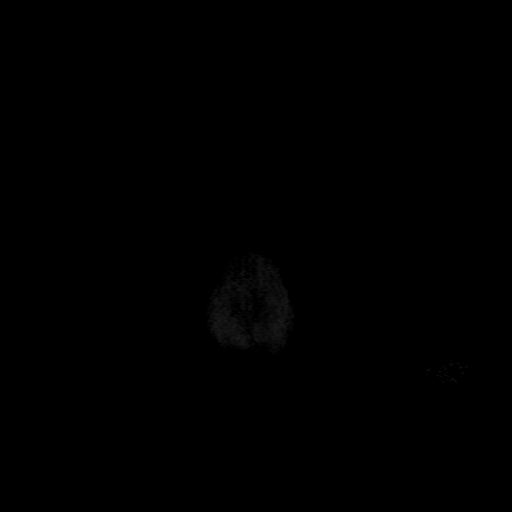

[Series 22: T1 post-contrast · coronal · 5.0mm · 0.39mm/px · 2 of 26 slices shown (2 of 2)]
[im 1/26]
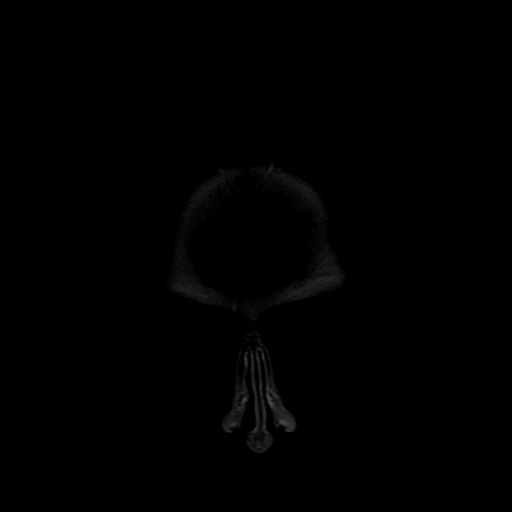
[im 26/26]
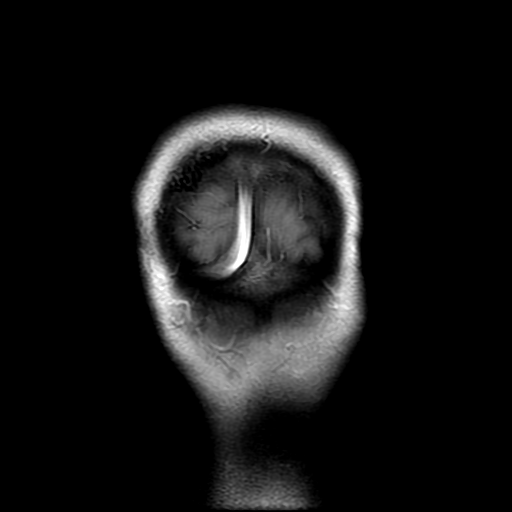

[Series 400: DWI · axial · 3.0mm · 1.09mm/px · z∈[-99,+24]mm · 4 of 44 slices shown (3 of 4)]
[im 1/44]
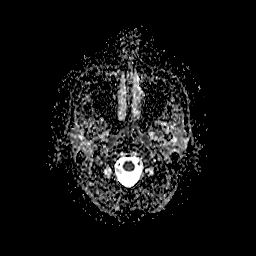
[im 15/44]
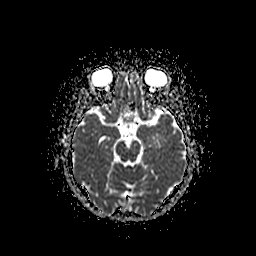
[im 29/44]
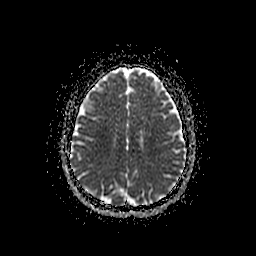
[im 44/44]
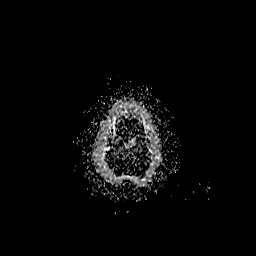

[Series 500: DWI · coronal · 5.0mm · 1.09mm/px · 3 of 34 slices shown (4 of 4)]
[im 1/34]
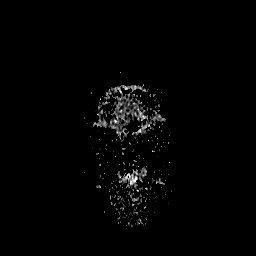
[im 17/34]
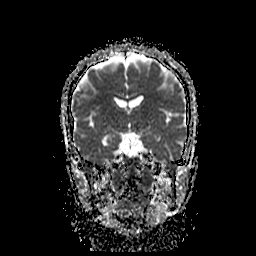
[im 34/34]
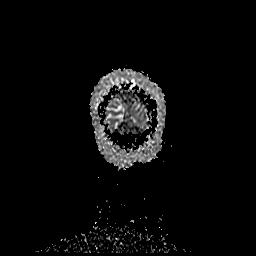

[35 of 48 positions shown; findings below may reference images not displayed]

FINDINGS: Brain: The brain has a normal appearance without evidence of
malformation, atrophy, old or acute small or large vessel
infarction, mass lesion, hemorrhage, hydrocephalus or extra-axial
collection. Cavernous sinus regions appear normal.

Vascular: Major vessels at the base of the brain show flow. Venous
sinuses appear patent.

Skull and upper cervical spine: Normal.

Sinuses/Orbits: Clear/normal.

Other: None significant.
IMPRESSION: Normal examination. No abnormality seen to explain the clinical
presentation.

## 2021-12-19 IMAGING — CT CT VENOGRAM HEAD
2 of 10 series · 8 of 47 positions shown · IV contrast (Omni 300)
Comparison: MRI same day.  Head CT yesterday.

CLINICAL DATA: Dural venous sinus thrombosis suspected. Monocular
vision loss. Headache.

EXAM:
CT VENOGRAM HEAD
TECHNIQUE: Venographic phase images of the brain were obtained following the
administration of intravenous contrast. Multiplanar reformats and
maximum intensity projections were generated.

[Series 3: head without without · axial · non-contrast · 0.41mm/px · z∈[-83,-33]mm · 2 of 31 slices shown]
[im 11/31  brain]
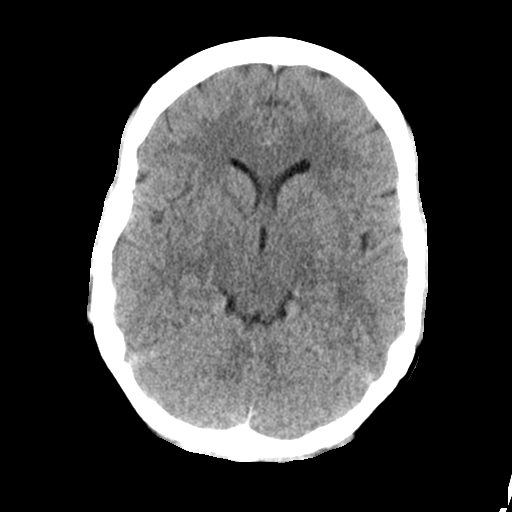
[im 21/31  brain]
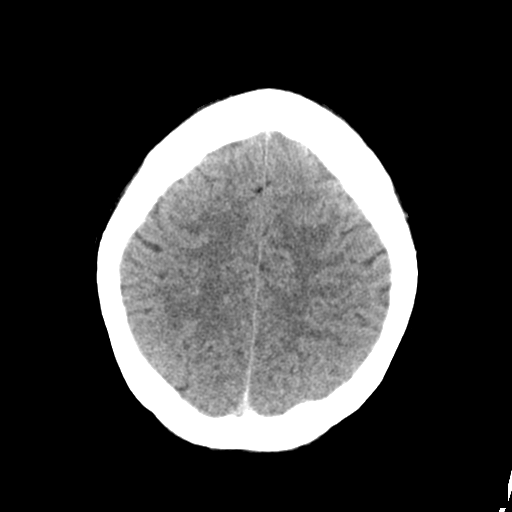

[Series 7: head with · axial · 0.41mm/px · z∈[-113,-5]mm · 6 of 76 slices shown]
[im 11/76  brain]
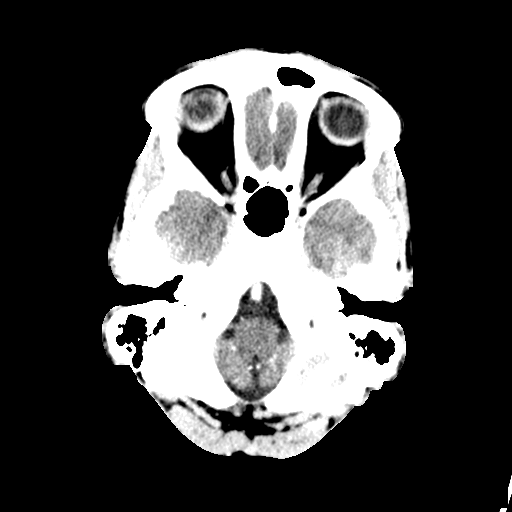
[im 22/76  bone]
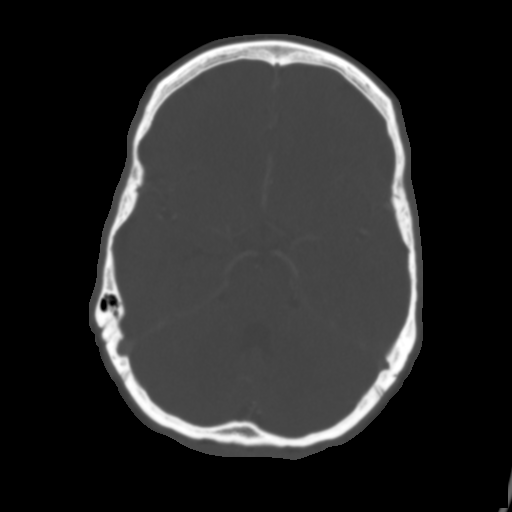
[im 33/76  brain]
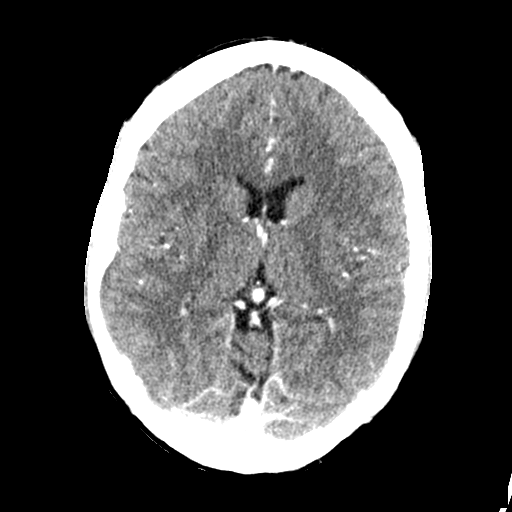
[im 43/76  bone]
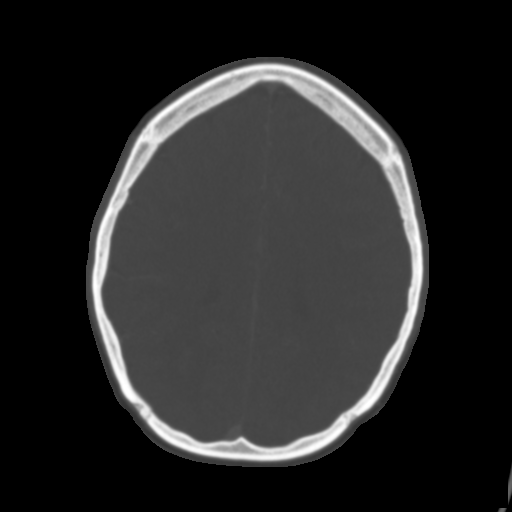
[im 54/76  brain]
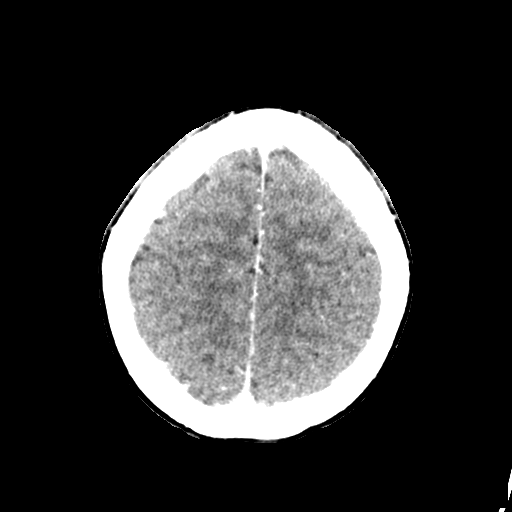
[im 65/76  bone]
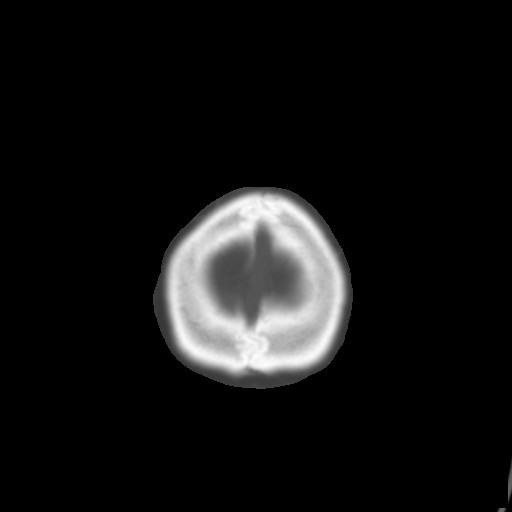

[8 of 47 positions shown; findings below may reference images not displayed]

RADIATION DOSE REDUCTION: This exam was performed according to the
departmental dose-optimization program which includes automated
exposure control, adjustment of the mA and/or kV according to
patient size and/or use of iterative reconstruction technique.

CONTRAST:  75mL OMNIPAQUE IOHEXOL 350 MG/ML SOLN
FINDINGS: CT scan of the head is normal without evidence of atrophy, old or
acute infarction, mass lesion, hemorrhage, hydrocephalus or
extra-axial collection.

Scanning after contrast administration shows flow in although major
arterial structures. Superior sagittal sinus is normal. Deep venous
system is normal. Dominant right transverse sinus is normal. Non
dominant left transverse sinus shows a low-density filling defect in
the lateral transverse sinus to proximal sigmoid sinus which is
nonocclusive. This appears to be longer than atypical arachnoid
granulation. It is possible this could represent a nonocclusive
thrombus. I do not understand how this would relate to the clinical
presentation however, particularly with monocular vision loss.
Further arguing against this representing a thrombus is that it
appears to be lower density than the sinus on the noncontrast head
CT, axial image 12.
IMPRESSION: No occlusive venous sinus thrombosis. Filling defect within the
lateral non dominant left transverse sinus to proximal sigmoid
sinus. This is a common location for arachnoid granulations. The
margins of this appear fairly smooth as might be seen with an
arachnoid granulation. However, it is longer than often seen with an
arachnoid granulation. This entity can be seen on the pre contrast
CT scan as a lower density structure, axial image 12, arguing in
favor of arachnoid granulation over thrombus. Therefore, considering
all of the features, I favor this representing an arachnoid
granulation, but cannot completely exclude the possibility
nonocclusive thrombus within the left transverse sigmoid sinus,
unfortunately.

## 2021-12-19 MED ORDER — IOHEXOL 350 MG/ML SOLN
75.0000 mL | Freq: Once | INTRAVENOUS | Status: AC
  Administered 2021-12-19: 14:00:00 75 mL via INTRAVENOUS

## 2021-12-19 MED ORDER — PROCHLORPERAZINE EDISYLATE 10 MG/2ML IJ SOLN
10.0000 mg | Freq: Once | INTRAMUSCULAR | Status: AC
  Administered 2021-12-19: 09:00:00 10 mg via INTRAVENOUS
  Filled 2021-12-19: qty 2

## 2021-12-19 MED ORDER — IOHEXOL 350 MG/ML SOLN
75.0000 mL | Freq: Once | INTRAVENOUS | Status: AC | PRN
  Administered 2021-12-19: 11:00:00 75 mL via INTRAVENOUS

## 2021-12-19 MED ORDER — DIPHENHYDRAMINE HCL 50 MG/ML IJ SOLN
50.0000 mg | Freq: Once | INTRAMUSCULAR | Status: AC
  Administered 2021-12-19: 09:00:00 50 mg via INTRAVENOUS
  Filled 2021-12-19: qty 1

## 2021-12-19 MED ORDER — PROCHLORPERAZINE MALEATE 10 MG PO TABS: 10.0000 mg | ORAL_TABLET | Freq: Two times a day (BID) | ORAL | 0 refills | Status: AC | PRN

## 2021-12-19 MED ORDER — GADOBUTROL 1 MMOL/ML IV SOLN
5.0000 mL | Freq: Once | INTRAVENOUS | Status: AC | PRN
  Administered 2021-12-19: 11:00:00 5 mL via INTRAVENOUS

## 2021-12-19 MED ORDER — SODIUM CHLORIDE 0.9 % IV BOLUS
1000.0000 mL | Freq: Once | INTRAVENOUS | Status: AC
  Administered 2021-12-19: 09:00:00 1000 mL via INTRAVENOUS

## 2021-12-19 NOTE — ED Notes (Signed)
Pt returned to the waiting room

## 2021-12-19 NOTE — ED Notes (Signed)
Observed pt family wheeling the pt out of the waiting room

## 2021-12-19 NOTE — Discharge Instructions (Signed)
Your imaging today was overall reassuring with no evidence of acute MS, stroke, bleed, tumor, or convincing evidence of the clot we were discussing.  I suspect he had evolution of your chronic migrainous type headache that led to some of the symptoms.  After speaking with neurology, they agree that you are safe for discharge home but do recommend you follow with outpatient neurology and your outpatient ophthalmology and PCP teams.  We discussed work-up here but given your improving symptoms we felt it is reasonable to let you go home as we discussed.  Please rest and stay hydrated and if any symptoms change or worsen acutely, please return to the nearest emergency department.

## 2021-12-19 NOTE — ED Notes (Signed)
Patient still currently in MRI/CT

## 2021-12-19 NOTE — ED Notes (Signed)
IV REMOVED BEFORE DIS CHARGE

## 2021-12-19 NOTE — ED Provider Notes (Signed)
[facility] EMERGENCY DEPARTMENT Provider Note   CSN: [id number] Arrival date & time: 12/18/21  2113     History  Chief Complaint  Patient presents with   Headache   Eye Problem    Jill Fields is a 36 y.o. female.  The history is provided by the patient. No language interpreter was used.  Headache Pain location:  Frontal, R temporal and occipital Quality:  Dull Radiates to:  Eyes Severity currently:  8/10 Severity at highest:  9/10 Onset quality:  Gradual Duration:  5 days Timing:  Constant Progression:  Unchanged Similar to prior headaches: worse.   Context: bright light   Relieved by:  Nothing Worsened by:  Light Ineffective treatments:  None tried Associated symptoms: blurred vision, cough, nausea, photophobia, tingling and visual change   Associated symptoms: no abdominal pain, no back pain, no congestion, no diarrhea, no fatigue, no fever, no focal weakness, no hearing loss, no loss of balance, no myalgias, no near-syncope, no neck pain, no neck stiffness, no numbness, no seizures, no syncope, no URI, no vomiting and no weakness   Eye Problem Location:  Both eyes (R > L) Quality:  Dull Timing:  Constant Progression:  Worsening Chronicity:  New Relieved by:  Nothing Worsened by:  Bright light Ineffective treatments:  None tried Associated symptoms: blurred vision, decreased vision, headaches, nausea and photophobia   Associated symptoms: no crusting, no discharge, no double vision, no facial rash, no numbness, no redness, no tearing, no vomiting and no weakness   Risk factors: no previous injury to eye       Home Medications Prior to Admission medications   Medication Sig Start Date End Date Taking? Authorizing Provider  albuterol (VENTOLIN HFA) 108 (90 Base) MCG/ACT inhaler Inhale 2 puffs into the lungs every 6 (six) hours as needed for wheezing or shortness of breath. 12/06/21   Brunetta Jeans, PA-C  clonazePAM (KLONOPIN) 1 MG tablet  Take 1 mg by mouth 2 (two) times daily.    [provider]  ondansetron (ZOFRAN) 4 MG tablet Take 1 tablet (4 mg total) by mouth every 8 (eight) hours as needed for nausea or vomiting. 12/06/21   Evelina Dun A, FNP  tiZANidine (ZANAFLEX) 4 MG tablet Take 1 tablet (4 mg total) by mouth every 8 (eight) hours as needed for muscle spasms. 07/24/21   Scot Jun, FNP  cetirizine (ZYRTEC ALLERGY) 10 MG tablet Take 1 tablet (10 mg total) by mouth daily. 08/30/20 04/06/21  Avegno, Darrelyn Hillock, FNP  fluticasone (FLONASE) 50 MCG/ACT nasal spray Place 1 spray into both nostrils daily for 14 days. 08/30/20 04/06/21  Avegno, Darrelyn Hillock, FNP      Allergies    Bee venom, Cat hair extract, Peanut oil, Ambien [zolpidem], and Latex    Review of Systems   Review of Systems  Constitutional:  Negative for fatigue and fever.  HENT:  Negative for congestion and hearing loss.   Eyes:  Positive for blurred vision and photophobia. Negative for double vision, discharge and redness.  Respiratory:  Positive for cough. Negative for chest tightness, shortness of breath and wheezing.   Cardiovascular:  Negative for chest pain, palpitations, leg swelling, syncope and near-syncope.  Gastrointestinal:  Positive for nausea. Negative for abdominal pain, constipation, diarrhea and vomiting.  Genitourinary:  Negative for dysuria and flank pain.  Musculoskeletal:  Negative for back pain, myalgias, neck pain and neck stiffness.  Neurological:  Positive for headaches. Negative for focal weakness, seizures, weakness, numbness and  loss of balance.   Physical Exam Updated Vital Signs BP 113/79 (BP Location: Left Arm)    Pulse 98    Temp 98.1 F (36.7 C) (Oral)    Resp 16    SpO2 99%  Physical Exam Vitals and nursing note reviewed.  Constitutional:      General: She is not in acute distress.    Appearance: She is well-developed. She is not ill-appearing, toxic-appearing or diaphoretic.  HENT:     Head: Normocephalic  and atraumatic.     Nose: No congestion or rhinorrhea.     Mouth/Throat:     Mouth: Mucous membranes are moist.     Pharynx: No oropharyngeal exudate or posterior oropharyngeal erythema.  Eyes:     General: No scleral icterus.       Right eye: No discharge.        Left eye: No discharge.     Extraocular Movements: Extraocular movements intact.     Right eye: Normal extraocular motion and no nystagmus.     Left eye: Normal extraocular motion and no nystagmus.     Conjunctiva/sclera: Conjunctivae normal.     Pupils: Pupils are equal, round, and reactive to light.     Right eye: Pupil is round and reactive.     Left eye: Pupil is round and reactive.  Neck:     Vascular: No carotid bruit.  Cardiovascular:     Rate and Rhythm: Normal rate and regular rhythm.     Heart sounds: No murmur heard. Pulmonary:     Effort: Pulmonary effort is normal. No respiratory distress.     Breath sounds: Normal breath sounds. No wheezing, rhonchi or rales.  Chest:     Chest wall: No tenderness.  Abdominal:     General: Abdomen is flat.     Palpations: Abdomen is soft.     Tenderness: There is no abdominal tenderness. There is no right CVA tenderness, left CVA tenderness, guarding or rebound.  Musculoskeletal:        General: No swelling or tenderness.     Cervical back: Neck supple. No tenderness.     Right lower leg: No edema.     Left lower leg: No edema.  Skin:    General: Skin is warm and dry.     Capillary Refill: Capillary refill takes less than 2 seconds.     Findings: No erythema.  Neurological:     Mental Status: She is alert and oriented to person, place, and time.     Cranial Nerves: No cranial nerve deficit.     Sensory: No sensory deficit.     Motor: No weakness.     Coordination: Coordination normal.  Psychiatric:        Mood and Affect: Mood normal.    ED Results / Procedures / Treatments   Labs (all labs ordered are listed, but only abnormal results are displayed) Labs  Reviewed  CBC WITH DIFFERENTIAL/PLATELET - Abnormal; Notable for the following components:      Result Value   WBC 10.9 (*)    RBC 5.13 (*)    Hemoglobin 15.6 (*)    HCT 46.7 (*)    Neutro Abs 8.2 (*)    All other components within normal limits  BASIC METABOLIC PANEL - Abnormal; Notable for the following components:   CO2 21 (*)    All other components within normal limits  RESP PANEL BY RT-PCR (FLU A&B, COVID) ARPGX2  I-STAT BETA HCG BLOOD, ED ([facility], [facility],  [facility] ONLY)    EKG None  Radiology DG Chest 2 View  Result Date: 12/19/2021 CLINICAL DATA:  Cough.  Difficulty breathing. EXAM: CHEST - 2 VIEW COMPARISON:  07/24/2021 FINDINGS: The heart size and mediastinal contours are within normal limits. Both lungs are clear. The visualized skeletal structures are unremarkable. IMPRESSION: No active cardiopulmonary disease. Electronically Signed   By: Markus Daft M.D.   On: 12/19/2021 10:54   CT HEAD WO CONTRAST (5MM)  Result Date: 12/18/2021 CLINICAL DATA:  Headache, new or worsening, neuro deficit (Age 64-49y), blurred vision EXAM: CT HEAD WITHOUT CONTRAST TECHNIQUE: Contiguous axial images were obtained from the base of the skull through the vertex without intravenous contrast. RADIATION DOSE REDUCTION: This exam was performed according to the departmental dose-optimization program which includes automated exposure control, adjustment of the mA and/or kV according to patient size and/or use of iterative reconstruction technique. COMPARISON:  None. FINDINGS: Brain: Normal anatomic configuration. No abnormal intra or extra-axial mass lesion or fluid collection. No abnormal mass effect or midline shift. No evidence of acute intracranial hemorrhage or infarct. Ventricular size is normal. Cerebellum unremarkable. Vascular: Unremarkable Skull: Intact Sinuses/Orbits: Paranasal sinuses are clear. Orbits are unremarkable. Other: Mastoid air cells and middle ear cavities are clear. IMPRESSION: No acute  intracranial abnormality.  Normal examination Electronically Signed   By: Fidela Salisbury M.D.   On: 12/18/2021 22:50   MR Brain W and Wo Contrast  Result Date: 12/19/2021 CLINICAL DATA:  Monocular vision loss. Headache. Right eye vision loss. Some blurred vision of the left eye. EXAM: MRI HEAD WITHOUT AND WITH CONTRAST TECHNIQUE: Multiplanar, multiecho pulse sequences of the brain and surrounding structures were obtained without and with intravenous contrast. CONTRAST:  80mL GADAVIST GADOBUTROL 1 MMOL/ML IV SOLN COMPARISON:  Head CT yesterday FINDINGS: Brain: The brain has a normal appearance without evidence of malformation, atrophy, old or acute small or large vessel infarction, mass lesion, hemorrhage, hydrocephalus or extra-axial collection. Cavernous sinus regions appear normal. Vascular: Major vessels at the base of the brain show flow. Venous sinuses appear patent. Skull and upper cervical spine: Normal. Sinuses/Orbits: Clear/normal. Other: None significant. IMPRESSION: Normal examination. No abnormality seen to explain the clinical presentation. Electronically Signed   By: Nelson Chimes M.D.   On: 12/19/2021 10:44   MR Cervical Spine W or Wo Contrast  Result Date: 12/19/2021 CLINICAL DATA:  Right eye vision loss. Headaches. Stabbing headache. EXAM: MRI CERVICAL SPINE WITHOUT AND WITH CONTRAST TECHNIQUE: Multiplanar and multiecho pulse sequences of the cervical spine, to include the craniocervical junction and cervicothoracic junction, were obtained without and with intravenous contrast. CONTRAST:  4mL GADAVIST GADOBUTROL 1 MMOL/ML IV SOLN COMPARISON:  None. FINDINGS: Alignment: Physiologic. Vertebrae: No acute fracture, evidence of discitis, or bone lesion. Cord: Normal signal and morphology. No areas of abnormal enhancement. Posterior Fossa, vertebral arteries, paraspinal tissues: Posterior fossa demonstrates no focal abnormality. Vertebral artery flow voids are maintained. Paraspinal soft tissues are  unremarkable. Disc levels: Discs: Disc spaces are maintained. C2-3: No significant disc bulge. No neural foraminal stenosis. No central canal stenosis. C3-4: No significant disc bulge. No neural foraminal stenosis. No central canal stenosis. C4-5: No significant disc bulge. No neural foraminal stenosis. No central canal stenosis. C5-6: Mild broad-based disc bulge. No foraminal or central canal stenosis. C6-7: No significant disc bulge. No neural foraminal stenosis. No central canal stenosis. C7-T1: No significant disc bulge. No neural foraminal stenosis. No central canal stenosis. IMPRESSION: 1.  No acute osseous injury of the cervical spine. 2.  Minimal broad-based disc bulge at C5-6. 3. No cervical spinal cord abnormality. Electronically Signed   By: Kathreen Devoid M.D.   On: 12/19/2021 10:48   CT VENOGRAM HEAD  Result Date: 12/19/2021 CLINICAL DATA:  Dural venous sinus thrombosis suspected. Monocular vision loss. Headache. EXAM: CT VENOGRAM HEAD TECHNIQUE: Venographic phase images of the brain were obtained following the administration of intravenous contrast. Multiplanar reformats and maximum intensity projections were generated. RADIATION DOSE REDUCTION: This exam was performed according to the departmental dose-optimization program which includes automated exposure control, adjustment of the mA and/or kV according to patient size and/or use of iterative reconstruction technique. CONTRAST:  47mL OMNIPAQUE IOHEXOL 350 MG/ML SOLN COMPARISON:  MRI same day.  Head CT yesterday. FINDINGS: CT scan of the head is normal without evidence of atrophy, old or acute infarction, mass lesion, hemorrhage, hydrocephalus or extra-axial collection. Scanning after contrast administration shows flow in although major arterial structures. Superior sagittal sinus is normal. Deep venous system is normal. Dominant right transverse sinus is normal. Non dominant left transverse sinus shows a low-density filling defect in the lateral  transverse sinus to proximal sigmoid sinus which is nonocclusive. This appears to be longer than atypical arachnoid granulation. It is possible this could represent a nonocclusive thrombus. I do not understand how this would relate to the clinical presentation however, particularly with monocular vision loss. Further arguing against this representing a thrombus is that it appears to be lower density than the sinus on the noncontrast head CT, axial image 12. IMPRESSION: No occlusive venous sinus thrombosis. Filling defect within the lateral non dominant left transverse sinus to proximal sigmoid sinus. This is a common location for arachnoid granulations. The margins of this appear fairly smooth as might be seen with an arachnoid granulation. However, it is longer than often seen with an arachnoid granulation. This entity can be seen on the pre contrast CT scan as a lower density structure, axial image 12, arguing in favor of arachnoid granulation over thrombus. 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 Electronically Signed   By: Nelson Chimes M.D.   On: 12/19/2021 11:37    Procedures Procedures    Medications Ordered in ED Medications  prochlorperazine (COMPAZINE) injection 10 mg (10 mg Intravenous Given 12/19/21 0843)  diphenhydrAMINE (BENADRYL) injection 50 mg (50 mg Intravenous Given 12/19/21 0843)  sodium chloride 0.9 % bolus 1,000 mL (0 mLs Intravenous Stopped 12/19/21 1048)  iohexol (OMNIPAQUE) 350 MG/ML injection 75 mL (75 mLs Intravenous Contrast Given 12/19/21 1346)  gadobutrol (GADAVIST) 1 MMOL/ML injection 5 mL (5 mLs Intravenous Contrast Given 12/19/21 1035)  iohexol (OMNIPAQUE) 350 MG/ML injection 75 mL (75 mLs Intravenous Contrast Given 12/19/21 1106)    ED Course/ Medical Decision Making/ A&P                            Medical Decision Making Amount and/or Complexity of Data Reviewed Radiology: ordered.  Risk Prescription drug management.    Tanasia Budzinski is a 36 y.o. female with a past medical history significant for previous cervical cancer and migraines who presents with progressive headaches and now vision loss.  According to patient, since Friday, for the last 5 days, she has had severe pain in her head going behind her right eye and also going towards her occiput with progressive vision loss.  She reports she  can barely see out of her right eye in all visual fields and is now starting to have some vision changes in her left eye.  She reports that she did a virtual visit 4 days ago and was told to follow-up and then went to urgent care yesterday when she was then told to come the emergency department.  Of note, she has been waiting over 11 hours in the waiting room prior to my initial evaluation.  Patient reports she is intermittently had some numbness and tingling in her left arm but denies full weakness.  She denies any fevers but says she had a chill yesterday.  She reports some nausea with her vision changes and reports some dizziness that feels like her brain is "spinning inside her head".  She denies any falls.  Denies any symptoms like this during previous migraines.  She denies any recent neck massages, chiropractor work, or manipulation.  Denies any chest pain, shortness of breath, constipation, diarrhea, or urinary changes.  Denies any rashes.  Denies elevated blood pressures.  She also reports some recent cough.  On exam, lungs clear and chest nontender.  Abdomen nontender.  No distention numbness or weakness in arms or legs on my exam.  Initially possible subtle weakness in left leg compared to right but then with better effort they were symmetric.  Symmetric smile.  Normal sensation of the face.  Patient was unable to see a red object well enough to for red desaturation with the right eye  compared to left.  Clinically I am concerned that patient could have a complicated migraine however I do feel need to rule out other etiologies like dural venous sinus thrombosis, stroke, or even MS.  Less suspicion for a malignancy at this time however I do suspect that MRI and more advanced imaging will be needed.  Patient did have a CT without contrast in triage that did not show acute bleeding.  Will call neurology to discuss neck steps in regards to imaging and work-up.   8:41 AM Just spoke to neurology who recommended MRI brain with and without, MRI C-spine with and without, and CT venogram of the head.  Will reassess patient after headache cocktail and imaging is completed to determine disposition.  We will also get chest x-ray and COVID swab given the recent cough.  2:25 PM MRI head and neck were reassuring and the CTV did not show convincing evidence of clot.  I spoke with neurology also look with radiology about a arachnoid granulation looking abnormal but neurology does not feel this is a clot.  Headache has improved after medications and she is starting to feel better.  I suspect this is escalation and evolution of her previous migraines and patient wants to follow-up with outpatient neurology and her ophthalmologist.  I did offer staining of the eye and checking of the pressures although she denies any history of glaucoma or other eye trouble recently.  Patient does not want to do this at this time and as she started to feel better, we will get her ready for discharge home.  Patient will be discharged.         Final Clinical Impression(s) / ED Diagnoses Final diagnoses:  Bad headache  Changes in vision    Rx / DC Orders ED Discharge Orders          Ordered    prochlorperazine (COMPAZINE) 10 MG tablet  2 times daily PRN        12/19/21 1432  Clinical Impression: 1. Bad headache   2. Changes in vision     Disposition: Discharge  Condition: Good  I  have discussed the results, Dx and Tx plan with the pt(& family if present). He/she/they expressed understanding and agree(s) with the plan. Discharge instructions discussed at great length. Strict return precautions discussed and pt &/or family have verbalized understanding of the instructions. No further questions at time of discharge.    New Prescriptions   No medications on file    Follow Up: [facility] [address]     [address] [place] [address] [place] [address] [id number] [place] [address] [phone] Schedule an appointment as soon as possible for a visit       Slade Pierpoint, Gwenyth Allegra, MD 12/19/21 1442

## 2022-03-27 ENCOUNTER — Telehealth: Payer: Medicaid Other | Admitting: Physician Assistant

## 2022-03-27 DIAGNOSIS — R21 Rash and other nonspecific skin eruption: Secondary | ICD-10-CM | POA: Diagnosis not present

## 2022-03-27 MED ORDER — VALACYCLOVIR HCL 1 G PO TABS: 1000.0000 mg | ORAL_TABLET | Freq: Three times a day (TID) | ORAL | 0 refills | Status: AC

## 2022-03-27 MED ORDER — CEPHALEXIN 500 MG PO CAPS: 500.0000 mg | ORAL_CAPSULE | Freq: Two times a day (BID) | ORAL | 0 refills | Status: AC

## 2022-03-27 MED ORDER — HYDROXYZINE PAMOATE 25 MG PO CAPS: 25.0000 mg | ORAL_CAPSULE | Freq: Three times a day (TID) | ORAL | 0 refills | Status: AC | PRN

## 2022-03-27 NOTE — Patient Instructions (Signed)
?Jill Fields, thank you for joining [name], PA-C for today's virtual visit.  While this provider is not your primary care provider (PCP), if your PCP is located in our provider database this encounter information will be shared with them immediately following your visit. ? ?Consent: ?(Patient) Jill Fields provided verbal consent for this virtual visit at the beginning of the encounter. ? ?Current Medications: ? ?Current Outpatient Medications:  ?  cephALEXin (KEFLEX) 500 MG capsule, Take 1 capsule (500 mg total) by mouth 2 (two) times daily for 7 days., Disp: 14 capsule, Rfl: 0 ?  hydrOXYzine (VISTARIL) 25 MG capsule, Take 1 capsule (25 mg total) by mouth every 8 (eight) hours as needed., Disp: 30 capsule, Rfl: 0 ?  valACYclovir (VALTREX) 1000 MG tablet, Take 1 tablet (1,000 mg total) by mouth 3 (three) times daily for 7 days., Disp: 21 tablet, Rfl: 0 ?  albuterol (VENTOLIN HFA) 108 (90 Base) MCG/ACT inhaler, Inhale 2 puffs into the lungs every 6 (six) hours as needed for wheezing or shortness of breath., Disp: 8 g, Rfl: 0 ?  amphetamine-dextroamphetamine (ADDERALL) 30 MG tablet, Take 1 tablet by mouth 2 (two) times daily., Disp: , Rfl:  ?  clonazePAM (KLONOPIN) 1 MG tablet, Take 1 mg by mouth 2 (two) times daily., Disp: , Rfl:  ?  ondansetron (ZOFRAN) 4 MG tablet, Take 1 tablet (4 mg total) by mouth every 8 (eight) hours as needed for nausea or vomiting., Disp: 20 tablet, Rfl: 0 ?  prochlorperazine (COMPAZINE) 10 MG tablet, Take 1 tablet (10 mg total) by mouth 2 (two) times daily as needed for nausea or vomiting., Disp: 10 tablet, Rfl: 0 ?  tiZANidine (ZANAFLEX) 4 MG tablet, Take 1 tablet (4 mg total) by mouth every 8 (eight) hours as needed for muscle spasms., Disp: 30 tablet, Rfl: 0 ?  topiramate (TOPAMAX) 25 MG tablet, Take 75 mg by mouth at bedtime., Disp: , Rfl:  ?  Vitamin D, Ergocalciferol, (DRISDOL) 1.25 MG (50000 UNIT) CAPS capsule, Take 50,000 Units by mouth once a week. Monday, Disp:  , Rfl:   ? ?Medications ordered in this encounter:  ?Meds ordered this encounter  ?Medications  ? hydrOXYzine (VISTARIL) 25 MG capsule  ?  Sig: Take 1 capsule (25 mg total) by mouth every 8 (eight) hours as needed.  ?  Dispense:  30 capsule  ?  Refill:  0  ?  Order Specific Question:   Supervising Provider  ?  Answer:   Noemi Chapel [[id number]]  ? cephALEXin (KEFLEX) 500 MG capsule  ?  Sig: Take 1 capsule (500 mg total) by mouth 2 (two) times daily for 7 days.  ?  Dispense:  14 capsule  ?  Refill:  0  ?  Order Specific Question:   Supervising Provider  ?  Answer:   Noemi Chapel [[id number]]  ? valACYclovir (VALTREX) 1000 MG tablet  ?  Sig: Take 1 tablet (1,000 mg total) by mouth 3 (three) times daily for 7 days.  ?  Dispense:  21 tablet  ?  Refill:  0  ?  Order Specific Question:   Supervising Provider  ?  Answer:   Noemi Chapel [[id number]]  ?  ? ?*If you need refills on other medications prior to your next appointment, please contact your pharmacy* ? ?Follow-Up: ?Call back or seek an in-person evaluation if the symptoms worsen or if the condition fails to improve as anticipated. ? ?Other Instructions ?Please keep scalp clean and dry. ?Consider use of a  Witch hazel astringent to the area. ?Avoid wearing caps or anything that would increase sweating in the area. ?Take the antibiotic as directed until completed. ?Again this does not seem consistent with shingles but giving your history and concern I have sent in an antiviral for you to take as directed.  ? ? ?If you have been instructed to have an in-person evaluation today at a local Urgent Care facility, please use the link below. It will take you to a list of all of our available [facility], including address, phone number and hours of operation. Please do not delay care.  ?[facility] ? ?If you or a family member do not have a primary care provider, use the link below to schedule a visit and establish care. When you choose a [facility] primary care  physician or advanced practice provider, you gain a long-term partner in health. ?Find a Primary Care Provider ? ?Learn more about [facility] in-office and virtual care options: ?[facility]  ?

## 2022-03-27 NOTE — Progress Notes (Signed)
?Virtual Visit Consent  ? ?Jill Fields, you are scheduled for a virtual visit with a [facility] provider today.   ?  ?Just as with appointments in the office, your consent must be obtained to participate.  Your consent will be active for this visit and any virtual visit you may have with one of our providers in the next 365 days.   ?  ?If you have a MyChart account, a copy of this consent can be sent to you electronically.  All virtual visits are billed to your insurance company just like a traditional visit in the office.   ? ?As this is a virtual visit, video technology does not allow for your provider to perform a traditional examination.  This may limit your provider's ability to fully assess your condition.  If your provider identifies any concerns that need to be evaluated in person or the need to arrange testing (such as labs, EKG, etc.), we will make arrangements to do so.   ?  ?Although advances in technology are sophisticated, we cannot ensure that it will always work on either your end or our end.  If the connection with a video visit is poor, the visit may have to be switched to a telephone visit.  With either a video or telephone visit, we are not always able to ensure that we have a secure connection.   ? ?Also, by engaging in this virtual visit, you consent to the provision of healthcare. Additionally, you authorize for your insurance to be billed (if applicable) for the services provided during this visit. I also discussed with the patient that there may be a patient responsible charge related to this service. ? ?I need to obtain your verbal consent now.   Are you willing to proceed with your visit today?  ?  ?Jill Fields has provided verbal consent on 03/27/2022 for a virtual visit (video or telephone). ?  ?Jill Rio, PA-C  ? ?Date: 03/27/2022 7:20 PM ? ? ?Virtual Visit via Video Note  ? ?ILeeanne Fields, connected with  Jill Fields  ([id number], 08-Feb-1986) on 03/27/22 at   7:00 PM EDT by a video-enabled telemedicine application and verified that I am speaking with the correct person using two identifiers. ? ?Location: ?Patient: Virtual Visit Location Patient: Other: Parked Car ?Provider: Virtual Visit Location Provider: Home Office ?  ?I discussed the limitations of evaluation and management by telemedicine and the availability of in person appointments. The patient expressed understanding and agreed to proceed.   ? ?History of Present Illness: ?Jill Fields is a 36 y.o. who identifies as a female who was assigned female at birth, and is being seen today for possible shingles. Notes an itchy and slightly burning rash of her hairline starting yesterday with lesions across forehead and her entire scalp. Notes some lesions on back of neck bilaterally. Has history of shingles and states this feels very similar and she is concerned for this and would like to start treatment. Notes some of the areas are pustular. Denies change to soaps, lotions, detergents or other products used. Denies pet exposure. Denies recent travel or sick contact.  ? ?HPI: HPI  ?Problems:  ?Patient Active Problem List  ? Diagnosis Date Noted  ? 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 Endometriosis determined by laparoscopy 06/04/2019  ?  ?Allergies:  ?Allergies  ?Allergen Reactions  ? Bee Venom Anaphylaxis  ? Cat Hair Extract Anaphylaxis  ? Peanut Oil Anaphylaxis  ? Ambien [Zolpidem]   ?  Latex   ? ?Medications:  ?Current Outpatient Medications:  ?  cephALEXin (KEFLEX) 500 MG capsule, Take 1 capsule (500 mg total) by mouth 2 (two) times daily for 7 days., Disp: 14 capsule, Rfl: 0 ?  hydrOXYzine (VISTARIL) 25 MG capsule, Take 1 capsule (25 mg total) by mouth every 8 (eight) hours as needed., Disp: 30 capsule, Rfl: 0 ?  valACYclovir (VALTREX) 1000 MG tablet, Take 1 tablet (1,000 mg total) by mouth 3 (three) times daily for 7 days., Disp: 21 tablet, Rfl: 0 ?  albuterol (VENTOLIN HFA) 108 (90 Base) MCG/ACT inhaler,  Inhale 2 puffs into the lungs every 6 (six) hours as needed for wheezing or shortness of breath., Disp: 8 g, Rfl: 0 ?  amphetamine-dextroamphetamine (ADDERALL) 30 MG tablet, Take 1 tablet by mouth 2 (two) times daily., Disp: , Rfl:  ?  clonazePAM (KLONOPIN) 1 MG tablet, Take 1 mg by mouth 2 (two) times daily., Disp: , Rfl:  ?  ondansetron (ZOFRAN) 4 MG tablet, Take 1 tablet (4 mg total) by mouth every 8 (eight) hours as needed for nausea or vomiting., Disp: 20 tablet, Rfl: 0 ?  prochlorperazine (COMPAZINE) 10 MG tablet, Take 1 tablet (10 mg total) by mouth 2 (two) times daily as needed for nausea or vomiting., Disp: 10 tablet, Rfl: 0 ?  tiZANidine (ZANAFLEX) 4 MG tablet, Take 1 tablet (4 mg total) by mouth every 8 (eight) hours as needed for muscle spasms., Disp: 30 tablet, Rfl: 0 ?  topiramate (TOPAMAX) 25 MG tablet, Take 75 mg by mouth at bedtime., Disp: , Rfl:  ?  Vitamin D, Ergocalciferol, (DRISDOL) 1.25 MG (50000 UNIT) CAPS capsule, Take 50,000 Units by mouth once a week. Monday, Disp: , Rfl:  ? ?Observations/Objective: ?Patient is well-developed, well-nourished in no acute distress.  ?Resting comfortably in parked car.  ?Head is normocephalic, atraumatic.  ?No labored breathing. ?Speech is clear and coherent with logical content.  ?Patient is alert and oriented at baseline.  ?Scattered erythematous lesions of hairline mainly on R side of her face and neck but some noted on the L side as well from forehead, down behind the ear and posterior hairline in occipital region ? ?Assessment and Plan: ?1. Rash ?- hydrOXYzine (VISTARIL) 25 MG capsule; Take 1 capsule (25 mg total) by mouth every 8 (eight) hours as needed.  Dispense: 30 capsule; Refill: 0 ?- cephALEXin (KEFLEX) 500 MG capsule; Take 1 capsule (500 mg total) by mouth 2 (two) times daily for 7 days.  Dispense: 14 capsule; Refill: 0 ?- valACYclovir (VALTREX) 1000 MG tablet; Take 1 tablet (1,000 mg total) by mouth 3 (three) times daily for 7 days.  Dispense: 21  tablet; Refill: 0 ? ?Patient convinced of shingles rash giving itching and irritation of face and scalp. Discussed with her that shingles would not be bilateral and favor just the hairline typically. Concern for more of a folliculitis starting. As such will start Keflex BID x 7 days. Supportive measures and OTC medications reviewed. She is adamant about shingles so will give course of Valtrex giving low chance of side effect coupled with her adamancy and history of shingles. Hydroxyzine for itch. Strict in-person evaluation precautions discussed.  ? ?Follow Up Instructions: ?I discussed the assessment and treatment plan with the patient. The patient was provided an opportunity to ask questions and all were answered. The patient agreed with the plan and demonstrated an understanding of the instructions.  A copy of instructions were sent to the patient via MyChart unless otherwise noted  below.  ? ?The patient was advised to call back or seek an in-person evaluation if the symptoms worsen or if the condition fails to improve as anticipated. ? ?Time:  ?I spent 10 minutes with the patient via telehealth technology discussing the above problems/concerns.   ? ?Jill Rio, PA-C ?

## 2022-05-28 ENCOUNTER — Telehealth: Payer: Medicaid Other | Admitting: Physician Assistant

## 2022-05-28 DIAGNOSIS — K047 Periapical abscess without sinus: Secondary | ICD-10-CM

## 2022-05-28 MED ORDER — IBUPROFEN 800 MG PO TABS: 800.0000 mg | ORAL_TABLET | Freq: Three times a day (TID) | ORAL | 0 refills | Status: AC | PRN

## 2022-05-28 MED ORDER — ONDANSETRON HCL 4 MG PO TABS: 4.0000 mg | ORAL_TABLET | Freq: Three times a day (TID) | ORAL | 0 refills | Status: DC | PRN

## 2022-05-28 MED ORDER — AMOXICILLIN 500 MG PO CAPS: 500.0000 mg | ORAL_CAPSULE | Freq: Three times a day (TID) | ORAL | 0 refills | Status: AC

## 2022-05-28 NOTE — Patient Instructions (Signed)
[name], thank you for joining Mar Daring, PA-C for today's virtual visit.  While this provider is not your primary care provider (PCP), if your PCP is located in our provider database this encounter information will be shared with them immediately following your visit.  Consent: (Patient) Jill Fields provided verbal consent for this virtual visit at the beginning of the encounter.  Current Medications:  Current Outpatient Medications:    amoxicillin (AMOXIL) 500 MG capsule, Take 1 capsule (500 mg total) by mouth 3 (three) times daily for 10 days., Disp: 30 capsule, Rfl: 0   ibuprofen (ADVIL) 800 MG tablet, Take 1 tablet (800 mg total) by mouth every 8 (eight) hours as needed., Disp: 30 tablet, Rfl: 0   ondansetron (ZOFRAN) 4 MG tablet, Take 1 tablet (4 mg total) by mouth every 8 (eight) hours as needed for nausea or vomiting., Disp: 20 tablet, Rfl: 0   albuterol (VENTOLIN HFA) 108 (90 Base) MCG/ACT inhaler, Inhale 2 puffs into the lungs every 6 (six) hours as needed for wheezing or shortness of breath., Disp: 8 g, Rfl: 0   amphetamine-dextroamphetamine (ADDERALL) 30 MG tablet, Take 1 tablet by mouth 2 (two) times daily., Disp: , Rfl:    clonazePAM (KLONOPIN) 1 MG tablet, Take 1 mg by mouth 2 (two) times daily., Disp: , Rfl:    hydrOXYzine (VISTARIL) 25 MG capsule, Take 1 capsule (25 mg total) by mouth every 8 (eight) hours as needed., Disp: 30 capsule, Rfl: 0   prochlorperazine (COMPAZINE) 10 MG tablet, Take 1 tablet (10 mg total) by mouth 2 (two) times daily as needed for nausea or vomiting., Disp: 10 tablet, Rfl: 0   tiZANidine (ZANAFLEX) 4 MG tablet, Take 1 tablet (4 mg total) by mouth every 8 (eight) hours as needed for muscle spasms., Disp: 30 tablet, Rfl: 0   topiramate (TOPAMAX) 25 MG tablet, Take 75 mg by mouth at bedtime., Disp: , Rfl:    Vitamin D, Ergocalciferol, (DRISDOL) 1.25 MG (50000 UNIT) CAPS capsule, Take 50,000 Units by mouth once a week. Monday, Disp: ,  Rfl:    Medications ordered in this encounter:  Meds ordered this encounter  Medications   amoxicillin (AMOXIL) 500 MG capsule    Sig: Take 1 capsule (500 mg total) by mouth 3 (three) times daily for 10 days.    Dispense:  30 capsule    Refill:  0    Order Specific Question:   Supervising Provider    Answer:   MILLER, BRIAN [[id number]]   ibuprofen (ADVIL) 800 MG tablet    Sig: Take 1 tablet (800 mg total) by mouth every 8 (eight) hours as needed.    Dispense:  30 tablet    Refill:  0    Order Specific Question:   Supervising Provider    Answer:   MILLER, BRIAN [[id number]]   ondansetron (ZOFRAN) 4 MG tablet    Sig: Take 1 tablet (4 mg total) by mouth every 8 (eight) hours as needed for nausea or vomiting.    Dispense:  20 tablet    Refill:  0    Order Specific Question:   Supervising Provider    Answer:   Sabra Heck, BRIAN [[id number]]     *If you need refills on other medications prior to your next appointment, please contact your pharmacy*  Follow-Up: Call back or seek an in-person evaluation if the symptoms worsen or if the condition fails to improve as anticipated.  Other Instructions Dental Abscess  A dental abscess is an  area of pus in or around a tooth. It comes from an infection. It can cause pain and other symptoms. Treatment will help with symptoms and prevent the infection from spreading. What are the causes? This condition is caused by an infection in or around the tooth. This can be from: Very bad tooth decay (cavities). A bad injury to the tooth, such as a broken or chipped tooth. What increases the risk? The risk to get an abscess is higher in males. It is also more likely in people who: Have dental decay. Have very bad gum disease. Eat sugary snacks between meals. Use tobacco. Have diabetes. Have a weak disease-fighting system (immune system). Do not brush their teeth regularly. What are the signs or symptoms? Some mild symptoms are: Tenderness. Bad breath. Fever. A  sharp, sour taste in the mouth. Pain in and around the infected tooth. Worse symptoms of this condition include: Swollen neck glands. Chills. Pus draining around the tooth. Swelling and redness around the tooth, the mouth, or the face. Very bad pain in and around the tooth. The worst symptoms can include: Difficulty swallowing. Difficulty opening your mouth. Feeling like you may vomit or vomiting. How is this treated? This is treated by getting rid of the infection. Your dentist will discuss ways to do this, including: Antibiotic medicines. Antibacterial mouth rinse. An incision in the abscess to drain out the pus. A root canal. Removing the tooth. Follow these instructions at home: Medicines Take over-the-counter and prescription medicines only as told by your dentist. If you were prescribed an antibiotic medicine, take it as told by your dentist. Do not stop taking it even if you start to feel better. If you were prescribed a gel that has numbing medicine in it, use it exactly as told. Ask your dentist if you should avoid driving or using machines while you are taking your medicine. General instructions Rinse your mouth often with salt water. To make salt water, dissolve -1 tsp (3-6 g) of salt in 1 cup (237 mL) of warm water. Eat a soft diet while your mouth is healing. Drink enough fluid to keep your pee (urine) pale yellow. Do not apply heat to the outside of your mouth. Do not smoke or use any products that contain nicotine or tobacco. If you need help quitting, ask your dentist. Keep all follow-up visits. Prevent an abscess Brush your teeth every morning and every night. Use fluoride toothpaste. Floss your teeth each day. Get dental cleanings as often as told by your dentist. Think about getting dental sealant put on teeth that have deep holes (decay). Drink water that has fluoride in it. Most tap water has fluoride. Check the label on bottled water to see if it has  fluoride in it. Drink water instead of sugary drinks. Eat healthy meals and snacks. Wear a mouth guard or face shield when you play sports. Contact a doctor if: Your pain is worse and medicine does not help. Get help right away if: You have a fever or chills. Your symptoms suddenly get worse. You have a very bad headache. You have problems breathing or swallowing. You have trouble opening your mouth. You have swelling in your neck or close to your eye. These symptoms may be an emergency. Get help right away. Call your local emergency services (911 in the [place] Do not wait to see if the symptoms will go away. Do not drive yourself to the hospital. Summary A dental abscess is an area of pus in or  around a tooth. It is caused by an infection. Treatment will help with symptoms and prevent the infection from spreading. Take over-the-counter and prescription medicines only as told by your dentist. To prevent an abscess, take good care of your teeth. Brush your teeth every morning and night. Use floss every day. Get dental cleanings as often as told by your dentist. This information is not intended to replace advice given to you by your health care provider. Make sure you discuss any questions you have with your health care provider. Document Revised: 01/19/2021 Document Reviewed: 01/19/2021 Elsevier Patient Education  [employer].    If you have been instructed to have an in-person evaluation today at a local Urgent Care facility, please use the link below. It will take you to a list of all of our available [facility], including address, phone number and hours of operation. Please do not delay care.  [facility]  If you or a family member do not have a primary care provider, use the link below to schedule a visit and establish care. When you choose a [facility] primary care physician or advanced practice provider, you gain a long-term partner in  health. Find a Primary Care Provider  Learn more about [facility]'s in-office and virtual care options: [facility]

## 2022-05-28 NOTE — Progress Notes (Signed)
Virtual Visit Consent   Jaria Conway, you are scheduled for a virtual visit with a [facility] provider today. Just as with appointments in the office, your consent must be obtained to participate. Your consent will be active for this visit and any virtual visit you may have with one of our providers in the next 365 days. If you have a MyChart account, a copy of this consent can be sent to you electronically.  As this is a virtual visit, video technology does not allow for your provider to perform a traditional examination. This may limit your provider's ability to fully assess your condition. If your provider identifies any concerns that need to be evaluated in person or the need to arrange testing (such as labs, EKG, etc.), we will make arrangements to do so. Although advances in technology are sophisticated, we cannot ensure that it will always work on either your end or our end. If the connection with a video visit is poor, the visit may have to be switched to a telephone visit. With either a video or telephone visit, we are not always able to ensure that we have a secure connection.  By engaging in this virtual visit, you consent to the provision of healthcare and authorize for your insurance to be billed (if applicable) for the services provided during this visit. Depending on your insurance coverage, you may receive a charge related to this service.  I need to obtain your verbal consent now. Are you willing to proceed with your visit today? Esther Bradstreet has provided verbal consent on 05/28/2022 for a virtual visit (video or telephone). Mar Daring, PA-C  Date: 05/28/2022 9:07 AM  Virtual Visit via Video Note   I, Mar Daring, connected with  Lonnie Rosado  ([id number], 21-Aug-1986) on 05/28/22 at  9:00 AM EDT by a video-enabled telemedicine application and verified that I am speaking with the correct person using two identifiers.  Location: Patient: Virtual Visit Location  Patient: Home Provider: Virtual Visit Location Provider: Home Office   I discussed the limitations of evaluation and management by telemedicine and the availability of in person appointments. The patient expressed understanding and agreed to proceed.    History of Present Illness: Roxie Kreeger is a 36 y.o. who identifies as a female who was assigned female at birth, and is being seen today for broken tooth, dental infection, and swollen left jaw.  HPI: Dental Pain  This is a new problem. Episode onset: tooth broke, but swelling started yesterday. The problem occurs constantly. The problem has been gradually worsening. The pain is moderate. Associated symptoms include facial pain and thermal sensitivity. Pertinent negatives include no difficulty swallowing, fever, oral bleeding or sinus pressure. She has tried acetaminophen and NSAIDs for the symptoms. The treatment provided no relief.    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   Endometriosis determined by laparoscopy 06/04/2019    Allergies:  Allergies  Allergen Reactions   Bee Venom Anaphylaxis   Cat Hair Extract Anaphylaxis   Peanut Oil Anaphylaxis   Ambien [Zolpidem]    Latex    Medications:  Current Outpatient Medications:    amoxicillin (AMOXIL) 500 MG capsule, Take 1 capsule (500 mg total) by mouth 3 (three) times daily for 10 days., Disp: 30 capsule, Rfl: 0   ibuprofen (ADVIL) 800 MG tablet, Take 1 tablet (800 mg total) by mouth every 8 (eight) hours as needed., Disp: 30 tablet,  Rfl: 0   ondansetron (ZOFRAN) 4 MG tablet, Take 1 tablet (4 mg total) by mouth every 8 (eight) hours as needed for nausea or vomiting., Disp: 20 tablet, Rfl: 0   albuterol (VENTOLIN HFA) 108 (90 Base) MCG/ACT inhaler, Inhale 2 puffs into the lungs every 6 (six) hours as needed for wheezing or shortness of breath., Disp: 8 g, Rfl: 0   amphetamine-dextroamphetamine (ADDERALL) 30 MG tablet,  Take 1 tablet by mouth 2 (two) times daily., Disp: , Rfl:    clonazePAM (KLONOPIN) 1 MG tablet, Take 1 mg by mouth 2 (two) times daily., Disp: , Rfl:    hydrOXYzine (VISTARIL) 25 MG capsule, Take 1 capsule (25 mg total) by mouth every 8 (eight) hours as needed., Disp: 30 capsule, Rfl: 0   prochlorperazine (COMPAZINE) 10 MG tablet, Take 1 tablet (10 mg total) by mouth 2 (two) times daily as needed for nausea or vomiting., Disp: 10 tablet, Rfl: 0   tiZANidine (ZANAFLEX) 4 MG tablet, Take 1 tablet (4 mg total) by mouth every 8 (eight) hours as needed for muscle spasms., Disp: 30 tablet, Rfl: 0   topiramate (TOPAMAX) 25 MG tablet, Take 75 mg by mouth at bedtime., Disp: , Rfl:    Vitamin D, Ergocalciferol, (DRISDOL) 1.25 MG (50000 UNIT) CAPS capsule, Take 50,000 Units by mouth once a week. Monday, Disp: , Rfl:   Observations/Objective: Patient is well-developed, well-nourished in no acute distress.  Resting comfortably at home.  Head is normocephalic, atraumatic.  No labored breathing. Speech is clear and coherent with logical content.  Patient is alert and oriented at baseline.  Left lower jaw is obviously swollen, at least double compared to right  Assessment and Plan: 1. Dental infection - amoxicillin (AMOXIL) 500 MG capsule; Take 1 capsule (500 mg total) by mouth 3 (three) times daily for 10 days.  Dispense: 30 capsule; Refill: 0 - ibuprofen (ADVIL) 800 MG tablet; Take 1 tablet (800 mg total) by mouth every 8 (eight) hours as needed.  Dispense: 30 tablet; Refill: 0 - ondansetron (ZOFRAN) 4 MG tablet; Take 1 tablet (4 mg total) by mouth every 8 (eight) hours as needed for nausea or vomiting.  Dispense: 20 tablet; Refill: 0  - Suspect dental abscess - Amoxil prescribed - Ibuprofen for pain, can alternate with tylenol - Zofran for nausea - Salt water gargles - Seek in person evaluation if worsening or fails to improve  Follow Up Instructions: I discussed the assessment and treatment plan  with the patient. The patient was provided an opportunity to ask questions and all were answered. The patient agreed with the plan and demonstrated an understanding of the instructions.  A copy of instructions were sent to the patient via MyChart unless otherwise noted below.    The patient was advised to call back or seek an in-person evaluation if the symptoms worsen or if the condition fails to improve as anticipated.  Time:  I spent 12 minutes with the patient via telehealth technology discussing the above problems/concerns.    Mar Daring, PA-C

## 2022-09-17 ENCOUNTER — Telehealth: Payer: Medicaid Other | Admitting: Physician Assistant

## 2022-09-17 DIAGNOSIS — K047 Periapical abscess without sinus: Secondary | ICD-10-CM | POA: Diagnosis not present

## 2022-09-17 MED ORDER — NAPROXEN 500 MG PO TABS: 500.0000 mg | ORAL_TABLET | Freq: Two times a day (BID) | ORAL | 0 refills | Status: AC

## 2022-09-17 MED ORDER — AMOXICILLIN 500 MG PO CAPS: 500.0000 mg | ORAL_CAPSULE | Freq: Three times a day (TID) | ORAL | 0 refills | Status: AC

## 2022-09-17 NOTE — Progress Notes (Signed)

## 2022-09-17 NOTE — Progress Notes (Signed)
I have spent 5 minutes in review of e-visit questionnaire, review and updating patient chart, medical decision making and response to patient.   Eleny Cortez Cody Oluwateniola Leitch, PA-C    

## 2022-12-05 ENCOUNTER — Telehealth: Payer: Medicaid Other | Admitting: Physician Assistant

## 2022-12-05 DIAGNOSIS — R112 Nausea with vomiting, unspecified: Secondary | ICD-10-CM | POA: Diagnosis not present

## 2022-12-05 DIAGNOSIS — J019 Acute sinusitis, unspecified: Secondary | ICD-10-CM

## 2022-12-05 DIAGNOSIS — B9689 Other specified bacterial agents as the cause of diseases classified elsewhere: Secondary | ICD-10-CM | POA: Diagnosis not present

## 2022-12-05 MED ORDER — AMOXICILLIN-POT CLAVULANATE 875-125 MG PO TABS: 1.0000 | ORAL_TABLET | Freq: Two times a day (BID) | ORAL | 0 refills | Status: AC

## 2022-12-05 MED ORDER — ONDANSETRON 4 MG PO TBDP: 4.0000 mg | ORAL_TABLET | Freq: Three times a day (TID) | ORAL | 0 refills | Status: AC | PRN

## 2022-12-05 NOTE — Progress Notes (Signed)
The patient no-showed for appointment despite this provider sending direct link x 2 with no response and waiting for at least 10 minutes from appointment time for patient to join. They will be marked as a NS for this appointment/time.   Marycatherine Maniscalco M Peri Kreft, PA-C    

## 2022-12-05 NOTE — Patient Instructions (Signed)
[name], thank you for joining Mar Daring, PA-C for today's virtual visit.  While this provider is not your primary care provider (PCP), if your PCP is located in our provider database this encounter information will be shared with them immediately following your visit.   [place] account gives you access to today's visit and all your visits, tests, and labs performed at [facility] " click here if you don't have a [facility] account or go to [email or web address]  Consent: (Patient) Jill Fields provided verbal consent for this virtual visit at the beginning of the encounter.  Current Medications:  Current Outpatient Medications:    amoxicillin-clavulanate (AUGMENTIN) 875-125 MG tablet, Take 1 tablet by mouth 2 (two) times daily., Disp: 20 tablet, Rfl: 0   ondansetron (ZOFRAN-ODT) 4 MG disintegrating tablet, Take 1 tablet (4 mg total) by mouth every 8 (eight) hours as needed., Disp: 20 tablet, Rfl: 0   albuterol (VENTOLIN HFA) 108 (90 Base) MCG/ACT inhaler, Inhale 2 puffs into the lungs every 6 (six) hours as needed for wheezing or shortness of breath., Disp: 8 g, Rfl: 0   amphetamine-dextroamphetamine (ADDERALL) 30 MG tablet, Take 1 tablet by mouth 2 (two) times daily., Disp: , Rfl:    clonazePAM (KLONOPIN) 1 MG tablet, Take 1 mg by mouth 2 (two) times daily., Disp: , Rfl:    hydrOXYzine (VISTARIL) 25 MG capsule, Take 1 capsule (25 mg total) by mouth every 8 (eight) hours as needed., Disp: 30 capsule, Rfl: 0   ibuprofen (ADVIL) 800 MG tablet, Take 1 tablet (800 mg total) by mouth every 8 (eight) hours as needed., Disp: 30 tablet, Rfl: 0   naproxen (NAPROSYN) 500 MG tablet, Take 1 tablet (500 mg total) by mouth 2 (two) times daily with a meal., Disp: 30 tablet, Rfl: 0   prochlorperazine (COMPAZINE) 10 MG tablet, Take 1 tablet (10 mg total) by mouth 2 (two) times daily as needed for nausea or vomiting., Disp: 10 tablet, Rfl: 0   tiZANidine  (ZANAFLEX) 4 MG tablet, Take 1 tablet (4 mg total) by mouth every 8 (eight) hours as needed for muscle spasms., Disp: 30 tablet, Rfl: 0   topiramate (TOPAMAX) 25 MG tablet, Take 75 mg by mouth at bedtime., Disp: , Rfl:    Vitamin D, Ergocalciferol, (DRISDOL) 1.25 MG (50000 UNIT) CAPS capsule, Take 50,000 Units by mouth once a week. Monday, Disp: , Rfl:    Medications ordered in this encounter:  Meds ordered this encounter  Medications   amoxicillin-clavulanate (AUGMENTIN) 875-125 MG tablet    Sig: Take 1 tablet by mouth 2 (two) times daily.    Dispense:  20 tablet    Refill:  0    Order Specific Question:   Supervising Provider    Answer:   LAMPTEY, PHILIP O [[id number]]   ondansetron (ZOFRAN-ODT) 4 MG disintegrating tablet    Sig: Take 1 tablet (4 mg total) by mouth every 8 (eight) hours as needed.    Dispense:  20 tablet    Refill:  0    Order Specific Question:   Supervising Provider    Answer:   Chase Picket [id number]     *If you need refills on other medications prior to your next appointment, please contact your pharmacy*  Follow-Up: Call back or seek an in-person evaluation if the symptoms worsen or if the condition fails to improve as anticipated.  [facility] [phone]  Other Instructions  Sinus Infection, Adult A sinus infection,  also called sinusitis, is inflammation of your sinuses. Sinuses are hollow spaces in the bones around your face. Your sinuses are located: Around your eyes. In the middle of your forehead. Behind your nose. In your cheekbones. Mucus normally drains out of your sinuses. When your nasal tissues become inflamed or swollen, mucus can become trapped or blocked. This allows bacteria, viruses, and fungi to grow, which leads to infection. Most infections of the sinuses are caused by a virus. A sinus infection can develop quickly. It can last for up to 4 weeks (acute) or for more than 12 weeks (chronic). A sinus infection often  develops after a cold. What are the causes? This condition is caused by anything that creates swelling in the sinuses or stops mucus from draining. This includes: Allergies. Asthma. Infection from bacteria or viruses. Deformities or blockages in your nose or sinuses. Abnormal growths in the nose (nasal polyps). Pollutants, such as chemicals or irritants in the air. Infection from fungi. This is rare. What increases the risk? You are more likely to develop this condition if you: Have a weak body defense system (immune system). Do a lot of swimming or diving. Overuse nasal sprays. Smoke. What are the signs or symptoms? The main symptoms of this condition are pain and a feeling of pressure around the affected sinuses. Other symptoms include: Stuffy nose or congestion that makes it difficult to breathe through your nose. Thick yellow or greenish drainage from your nose. Tenderness, swelling, and warmth over the affected sinuses. A cough that may get worse at night. Decreased sense of smell and taste. Extra mucus that collects in the throat or the back of the nose (postnasal drip) causing a sore throat or bad breath. Tiredness (fatigue). Fever. How is this diagnosed? This condition is diagnosed based on: Your symptoms. Your medical history. A physical exam. Tests to find out if your condition is acute or chronic. This may include: Checking your nose for nasal polyps. Viewing your sinuses using a device that has a light (endoscope). Testing for allergies or bacteria. Imaging tests, such as an MRI or CT scan. In rare cases, a bone biopsy may be done to rule out more serious types of fungal sinus disease. How is this treated? Treatment for a sinus infection depends on the cause and whether your condition is chronic or acute. If caused by a virus, your symptoms should go away on their own within 10 days. You may be given medicines to relieve symptoms. They include: Medicines that  shrink swollen nasal passages (decongestants). A spray that eases inflammation of the nostrils (topical intranasal corticosteroids). Rinses that help get rid of thick mucus in your nose (nasal saline washes). Medicines that treat allergies (antihistamines). Over-the-counter pain relievers. If caused by bacteria, your health care provider may recommend waiting to see if your symptoms improve. Most bacterial infections will get better without antibiotic medicine. You may be given antibiotics if you have: A severe infection. A weak immune system. If caused by narrow nasal passages or nasal polyps, surgery may be needed. Follow these instructions at home: Medicines Take, use, or apply over-the-counter and prescription medicines only as told by your health care provider. These may include nasal sprays. If you were prescribed an antibiotic medicine, take it as told by your health care provider. Do not stop taking the antibiotic even if you start to feel better. Hydrate and humidify  Drink enough fluid to keep your urine pale yellow. Staying hydrated will help to thin your mucus. Use  a cool mist humidifier to keep the humidity level in your home above 50%. Inhale steam for 10-15 minutes, 3-4 times a day, or as told by your health care provider. You can do this in the bathroom while a hot shower is running. Limit your exposure to cool or dry air. Rest Rest as much as possible. Sleep with your head raised (elevated). Make sure you get enough sleep each night. General instructions  Apply a warm, moist washcloth to your face 3-4 times a day or as told by your health care provider. This will help with discomfort. Use nasal saline washes as often as told by your health care provider. Wash your hands often with soap and water to reduce your exposure to germs. If soap and water are not available, use hand sanitizer. Do not smoke. Avoid being around people who are smoking (secondhand smoke). Keep all  follow-up visits. This is important. Contact a health care provider if: You have a fever. Your symptoms get worse. Your symptoms do not improve within 10 days. Get help right away if: You have a severe headache. You have persistent vomiting. You have severe pain or swelling around your face or eyes. You have vision problems. You develop confusion. Your neck is stiff. You have trouble breathing. These symptoms may be an emergency. Get help right away. Call 911. Do not wait to see if the symptoms will go away. Do not drive yourself to the hospital. Summary A sinus infection is soreness and inflammation of your sinuses. Sinuses are hollow spaces in the bones around your face. This condition is caused by nasal tissues that become inflamed or swollen. The swelling traps or blocks the flow of mucus. This allows bacteria, viruses, and fungi to grow, which leads to infection. If you were prescribed an antibiotic medicine, take it as told by your health care provider. Do not stop taking the antibiotic even if you start to feel better. Keep all follow-up visits. This is important. This information is not intended to replace advice given to you by your health care provider. Make sure you discuss any questions you have with your health care provider. Document Revised: 10/17/2021 Document Reviewed: 10/17/2021 Elsevier Patient Education  [place].    If you have been instructed to have an in-person evaluation today at a local Urgent Care facility, please use the link below. It will take you to a list of all of our available [facility], including address, phone number and hours of operation. Please do not delay care.  [facility]  If you or a family member do not have a primary care provider, use the link below to schedule a visit and establish care. When you choose a [facility] primary care physician or advanced practice provider, you gain a long-term partner in  health. Find a Primary Care Provider  Learn more about [facility]'s in-office and virtual care options: [facility]

## 2022-12-05 NOTE — Progress Notes (Signed)
Virtual Visit Consent   Analia Zuk, you are scheduled for a virtual visit with a [facility] provider today. Just as with appointments in the office, your consent must be obtained to participate. Your consent will be active for this visit and any virtual visit you may have with one of our providers in the next 365 days. If you have a MyChart account, a copy of this consent can be sent to you electronically.  As this is a virtual visit, video technology does not allow for your provider to perform a traditional examination. This may limit your provider's ability to fully assess your condition. If your provider identifies any concerns that need to be evaluated in person or the need to arrange testing (such as labs, EKG, etc.), we will make arrangements to do so. Although advances in technology are sophisticated, we cannot ensure that it will always work on either your end or our end. If the connection with a video visit is poor, the visit may have to be switched to a telephone visit. With either a video or telephone visit, we are not always able to ensure that we have a secure connection.  By engaging in this virtual visit, you consent to the provision of healthcare and authorize for your insurance to be billed (if applicable) for the services provided during this visit. Depending on your insurance coverage, you may receive a charge related to this service.  I need to obtain your verbal consent now. Are you willing to proceed with your visit today? Robie Oats has provided verbal consent on 12/05/2022 for a virtual visit (video or telephone). Mar Daring, PA-C  Date: 12/05/2022 2:35 PM  Virtual Visit via Video Note   I, Mar Daring, connected with  Jill Fields  ([id number], 06-30-1986) on 12/05/22 at  2:30 PM EST by a video-enabled telemedicine application and verified that I am speaking with the correct person using two identifiers.  Location: Patient: Virtual Visit Location  Patient: Home Provider: Virtual Visit Location Provider: Home Office   I discussed the limitations of evaluation and management by telemedicine and the availability of in person appointments. The patient expressed understanding and agreed to proceed.    History of Present Illness: Jill Fields is a 37 y.o. who identifies as a female who was assigned female at birth, and is being seen today for possible sinus infection.  HPI: Sinusitis This is a new problem. The current episode started in the past 7 days. Maximum temperature: low grade fever this morning. Associated symptoms include congestion, ear pain, headaches, a hoarse voice, sinus pressure (pressure behind eyes) and a sore throat (mild, from drainage). Pertinent negatives include no chills or coughing. (Nausea from drainage, vomiting, fatigue) Treatments tried: alka seltzer (vomited up), ibuprofen, tylenol, pepto bismol (vomit) The treatment provided no relief.     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   Endometriosis determined by laparoscopy 06/04/2019    Allergies:  Allergies  Allergen Reactions   Bee Venom Anaphylaxis   Cat Hair Extract Anaphylaxis   Peanut Oil Anaphylaxis   Ambien [Zolpidem]    Latex    Medications:  Current Outpatient Medications:    amoxicillin-clavulanate (AUGMENTIN) 875-125 MG tablet, Take 1 tablet by mouth 2 (two) times daily., Disp: 20 tablet, Rfl: 0   ondansetron (ZOFRAN-ODT) 4 MG disintegrating tablet, Take 1 tablet (4 mg total) by mouth every 8 (eight) hours as needed., Disp: 20 tablet, Rfl:  0   albuterol (VENTOLIN HFA) 108 (90 Base) MCG/ACT inhaler, Inhale 2 puffs into the lungs every 6 (six) hours as needed for wheezing or shortness of breath., Disp: 8 g, Rfl: 0   amphetamine-dextroamphetamine (ADDERALL) 30 MG tablet, Take 1 tablet by mouth 2 (two) times daily., Disp: , Rfl:    clonazePAM (KLONOPIN) 1 MG tablet, Take 1 mg by  mouth 2 (two) times daily., Disp: , Rfl:    hydrOXYzine (VISTARIL) 25 MG capsule, Take 1 capsule (25 mg total) by mouth every 8 (eight) hours as needed., Disp: 30 capsule, Rfl: 0   ibuprofen (ADVIL) 800 MG tablet, Take 1 tablet (800 mg total) by mouth every 8 (eight) hours as needed., Disp: 30 tablet, Rfl: 0   naproxen (NAPROSYN) 500 MG tablet, Take 1 tablet (500 mg total) by mouth 2 (two) times daily with a meal., Disp: 30 tablet, Rfl: 0   prochlorperazine (COMPAZINE) 10 MG tablet, Take 1 tablet (10 mg total) by mouth 2 (two) times daily as needed for nausea or vomiting., Disp: 10 tablet, Rfl: 0   tiZANidine (ZANAFLEX) 4 MG tablet, Take 1 tablet (4 mg total) by mouth every 8 (eight) hours as needed for muscle spasms., Disp: 30 tablet, Rfl: 0   topiramate (TOPAMAX) 25 MG tablet, Take 75 mg by mouth at bedtime., Disp: , Rfl:    Vitamin D, Ergocalciferol, (DRISDOL) 1.25 MG (50000 UNIT) CAPS capsule, Take 50,000 Units by mouth once a week. Monday, Disp: , Rfl:   Observations/Objective: Patient is well-developed, well-nourished in no acute distress.  Resting comfortably at home.  Head is normocephalic, atraumatic.  No labored breathing.  Speech is clear and coherent with logical content.  Patient is alert and oriented at baseline.    Assessment and Plan: 1. Acute bacterial sinusitis - amoxicillin-clavulanate (AUGMENTIN) 875-125 MG tablet; Take 1 tablet by mouth 2 (two) times daily.  Dispense: 20 tablet; Refill: 0  2. Nausea and vomiting, unspecified vomiting type - ondansetron (ZOFRAN-ODT) 4 MG disintegrating tablet; Take 1 tablet (4 mg total) by mouth every 8 (eight) hours as needed.  Dispense: 20 tablet; Refill: 0  - Worsening symptoms that have not responded to OTC medications.  - Will give Augmentin - Zofran added for nausea - Steam and humidifier can help - Stay well hydrated and get plenty of rest.  - Seek in person evaluation if no symptom improvement or if symptoms  worsen   Follow Up Instructions: I discussed the assessment and treatment plan with the patient. The patient was provided an opportunity to ask questions and all were answered. The patient agreed with the plan and demonstrated an understanding of the instructions.  A copy of instructions were sent to the patient via MyChart unless otherwise noted below.    The patient was advised to call back or seek an in-person evaluation if the symptoms worsen or if the condition fails to improve as anticipated.  Time:  I spent 10 minutes with the patient via telehealth technology discussing the above problems/concerns.    Mar Daring, PA-C

## 2023-01-05 ENCOUNTER — Telehealth: Payer: Medicaid Other | Admitting: Physician Assistant

## 2023-01-05 DIAGNOSIS — M5412 Radiculopathy, cervical region: Secondary | ICD-10-CM | POA: Diagnosis not present

## 2023-01-05 DIAGNOSIS — M62838 Other muscle spasm: Secondary | ICD-10-CM

## 2023-01-05 MED ORDER — CYCLOBENZAPRINE HCL 5 MG PO TABS: 5.0000 mg | ORAL_TABLET | Freq: Three times a day (TID) | ORAL | 0 refills | Status: AC | PRN

## 2023-01-05 MED ORDER — PREDNISONE 20 MG PO TABS: 40.0000 mg | ORAL_TABLET | Freq: Every day | ORAL | 0 refills | Status: AC

## 2023-01-05 NOTE — Progress Notes (Signed)
Virtual Visit Consent   Jill Fields, you are scheduled for a virtual visit with a [facility] provider today. Just as with appointments in the office, your consent must be obtained to participate. Your consent will be active for this visit and any virtual visit you may have with one of our providers in the next 365 days. If you have a MyChart account, a copy of this consent can be sent to you electronically.  As this is a virtual visit, video technology does not allow for your provider to perform a traditional examination. This may limit your provider's ability to fully assess your condition. If your provider identifies any concerns that need to be evaluated in person or the need to arrange testing (such as labs, EKG, etc.), we will make arrangements to do so. Although advances in technology are sophisticated, we cannot ensure that it will always work on either your end or our end. If the connection with a video visit is poor, the visit may have to be switched to a telephone visit. With either a video or telephone visit, we are not always able to ensure that we have a secure connection.  By engaging in this virtual visit, you consent to the provision of healthcare and authorize for your insurance to be billed (if applicable) for the services provided during this visit. Depending on your insurance coverage, you may receive a charge related to this service.  I need to obtain your verbal consent now. Are you willing to proceed with your visit today? [name] has provided verbal consent on 01/05/2023 for a virtual visit (video or telephone). [name], PA-C  Date: 01/05/2023 3:56 PM  Virtual Visit via Video Note   I, [name], connected with  Jill Fields  ([id number], [birth year]) on 01/05/23 at  3:30 PM EST by a video-enabled telemedicine application and verified that I am speaking with the correct person using two identifiers.  Location: Patient: Virtual Visit Location Patient:  Home Provider: Virtual Visit Location Provider: Home Office   I discussed the limitations of evaluation and management by telemedicine and the availability of in person appointments. The patient expressed understanding and agreed to proceed.    History of Present Illness: Jill Fields is a 37 y.o. who identifies as a female who was assigned female at birth, and is being seen today for neck pain that started several days ago with radiation to left arm.  She reports some left arm numbness and tingling.  Denies recent injury or trauma.  She reports pain with ROM of neck.  She has tried nsaids with minimal relief.  Denies chest pain, shortness of breath, palpitations, headache, visual changes.  HPI: HPI  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   Endometriosis determined by laparoscopy 06/04/2019    Allergies:  Allergies  Allergen Reactions   Bee Venom Anaphylaxis   Cat Hair Extract Anaphylaxis   Peanut Oil Anaphylaxis   Ambien [Zolpidem]    Latex    Medications:  Current Outpatient Medications:    albuterol (VENTOLIN HFA) 108 (90 Base) MCG/ACT inhaler, Inhale 2 puffs into the lungs every 6 (six) hours as needed for wheezing or shortness of breath., Disp: 8 g, Rfl: 0   amoxicillin-clavulanate (AUGMENTIN) 875-125 MG tablet, Take 1 tablet by mouth 2 (two) times daily., Disp: 20 tablet, Rfl: 0   amphetamine-dextroamphetamine (ADDERALL) 30 MG tablet, Take 1 tablet by mouth 2 (two) times daily.,  Disp: , Rfl:    clonazePAM (KLONOPIN) 1 MG tablet, Take 1 mg by mouth 2 (two) times daily., Disp: , Rfl:    cyclobenzaprine (FLEXERIL) 5 MG tablet, Take 1 tablet (5 mg total) by mouth 3 (three) times daily as needed for up to 10 days for muscle spasms., Disp: 30 tablet, Rfl: 0   hydrOXYzine (VISTARIL) 25 MG capsule, Take 1 capsule (25 mg total) by mouth every 8 (eight) hours as needed., Disp: 30 capsule, Rfl: 0   ibuprofen (ADVIL) 800 MG  tablet, Take 1 tablet (800 mg total) by mouth every 8 (eight) hours as needed., Disp: 30 tablet, Rfl: 0   naproxen (NAPROSYN) 500 MG tablet, Take 1 tablet (500 mg total) by mouth 2 (two) times daily with a meal., Disp: 30 tablet, Rfl: 0   ondansetron (ZOFRAN-ODT) 4 MG disintegrating tablet, Take 1 tablet (4 mg total) by mouth every 8 (eight) hours as needed., Disp: 20 tablet, Rfl: 0   predniSONE (DELTASONE) 20 MG tablet, Take 2 tablets (40 mg total) by mouth daily with breakfast for 5 days., Disp: 10 tablet, Rfl: 0   prochlorperazine (COMPAZINE) 10 MG tablet, Take 1 tablet (10 mg total) by mouth 2 (two) times daily as needed for nausea or vomiting., Disp: 10 tablet, Rfl: 0   topiramate (TOPAMAX) 25 MG tablet, Take 75 mg by mouth at bedtime., Disp: , Rfl:    Vitamin D, Ergocalciferol, (DRISDOL) 1.25 MG (50000 UNIT) CAPS capsule, Take 50,000 Units by mouth once a week. Monday, Disp: , Rfl:   Observations/Objective: Patient is well-developed, well-nourished in no acute distress.  Resting comfortably  at home.  Head is normocephalic, atraumatic.  No labored breathing.  Speech is clear and coherent with logical content.  Patient is alert and oriented at baseline.    Assessment and Plan: 1. Neck muscle spasm - cyclobenzaprine (FLEXERIL) 5 MG tablet; Take 1 tablet (5 mg total) by mouth 3 (three) times daily as needed for up to 10 days for muscle spasms.  Dispense: 30 tablet; Refill: 0 - predniSONE (DELTASONE) 20 MG tablet; Take 2 tablets (40 mg total) by mouth daily with breakfast for 5 days.  Dispense: 10 tablet; Refill: 0  2. Cervical radiculopathy - cyclobenzaprine (FLEXERIL) 5 MG tablet; Take 1 tablet (5 mg total) by mouth 3 (three) times daily as needed for up to 10 days for muscle spasms.  Dispense: 30 tablet; Refill: 0 - predniSONE (DELTASONE) 20 MG tablet; Take 2 tablets (40 mg total) by mouth daily with breakfast for 5 days.  Dispense: 10 tablet; Refill: 0  Advised in person follow up if  no improvement.  ED precautions given.   Follow Up Instructions: I discussed the assessment and treatment plan with the patient. The patient was provided an opportunity to ask questions and all were answered. The patient agreed with the plan and demonstrated an understanding of the instructions.  A copy of instructions were sent to the patient via MyChart unless otherwise noted below.     The patient was advised to call back or seek an in-person evaluation if the symptoms worsen or if the condition fails to improve as anticipated.  Time:  I spent 9 minutes with the patient via telehealth technology discussing the above problems/concerns.    [name], PA-C

## 2023-01-05 NOTE — Patient Instructions (Signed)
[name], thank you for joining Higgins, PA-C for today's virtual visit.  While this provider is not your primary care provider (PCP), if your PCP is located in our provider database this encounter information will be shared with them immediately following your visit.   [facility] account gives you access to today's visit and all your visits, tests, and labs performed at [facility] " click here if you don't have a [facility] account or go to [email or web address]  Consent: (Patient) Jill Fields provided verbal consent for this virtual visit at the beginning of the encounter.  Current Medications:  Current Outpatient Medications:    albuterol (VENTOLIN HFA) 108 (90 Base) MCG/ACT inhaler, Inhale 2 puffs into the lungs every 6 (six) hours as needed for wheezing or shortness of breath., Disp: 8 g, Rfl: 0   amoxicillin-clavulanate (AUGMENTIN) 875-125 MG tablet, Take 1 tablet by mouth 2 (two) times daily., Disp: 20 tablet, Rfl: 0   amphetamine-dextroamphetamine (ADDERALL) 30 MG tablet, Take 1 tablet by mouth 2 (two) times daily., Disp: , Rfl:    clonazePAM (KLONOPIN) 1 MG tablet, Take 1 mg by mouth 2 (two) times daily., Disp: , Rfl:    cyclobenzaprine (FLEXERIL) 5 MG tablet, Take 1 tablet (5 mg total) by mouth 3 (three) times daily as needed for up to 10 days for muscle spasms., Disp: 30 tablet, Rfl: 0   hydrOXYzine (VISTARIL) 25 MG capsule, Take 1 capsule (25 mg total) by mouth every 8 (eight) hours as needed., Disp: 30 capsule, Rfl: 0   ibuprofen (ADVIL) 800 MG tablet, Take 1 tablet (800 mg total) by mouth every 8 (eight) hours as needed., Disp: 30 tablet, Rfl: 0   naproxen (NAPROSYN) 500 MG tablet, Take 1 tablet (500 mg total) by mouth 2 (two) times daily with a meal., Disp: 30 tablet, Rfl: 0   ondansetron (ZOFRAN-ODT) 4 MG disintegrating tablet, Take 1 tablet (4 mg total) by mouth every 8 (eight) hours as needed., Disp: 20 tablet, Rfl: 0    predniSONE (DELTASONE) 20 MG tablet, Take 2 tablets (40 mg total) by mouth daily with breakfast for 5 days., Disp: 10 tablet, Rfl: 0   prochlorperazine (COMPAZINE) 10 MG tablet, Take 1 tablet (10 mg total) by mouth 2 (two) times daily as needed for nausea or vomiting., Disp: 10 tablet, Rfl: 0   topiramate (TOPAMAX) 25 MG tablet, Take 75 mg by mouth at bedtime., Disp: , Rfl:    Vitamin D, Ergocalciferol, (DRISDOL) 1.25 MG (50000 UNIT) CAPS capsule, Take 50,000 Units by mouth once a week. Monday, Disp: , Rfl:    Medications ordered in this encounter:  Meds ordered this encounter  Medications   cyclobenzaprine (FLEXERIL) 5 MG tablet    Sig: Take 1 tablet (5 mg total) by mouth 3 (three) times daily as needed for up to 10 days for muscle spasms.    Dispense:  30 tablet    Refill:  0    Order Specific Question:   Supervising Provider    Answer:   Chase Picket [id number]   predniSONE (DELTASONE) 20 MG tablet    Sig: Take 2 tablets (40 mg total) by mouth daily with breakfast for 5 days.    Dispense:  10 tablet    Refill:  0    Order Specific Question:   Supervising Provider    Answer:   Chase Picket [id number]     *If you need refills on other medications prior to your next appointment,  please contact your pharmacy*  Follow-Up: Call back or seek an in-person evaluation if the symptoms worsen or if the condition fails to improve as anticipated.  [facility] [phone]  Other Instructions Take prednisone as prescribed.  Take muscle relaxer as needed for muscle spasm.  If symptoms become worse or you have no improvement follow up with PCP for in person evaluation.  If you have been instructed to have an in-person evaluation today at a local Urgent Care facility, please use the link below. It will take you to a list of all of our available [facility], including address, phone number and hours of operation. Please do not delay care.  [facility]  If you or a family member do not have a primary care provider, use the link below to schedule a visit and establish care. When you choose a [facility] primary care physician or advanced practice provider, you gain a long-term partner in health. Find a Primary Care Provider  Learn more about [facility]'s in-office and virtual care options: [facility]

## 2023-01-12 ENCOUNTER — Telehealth: Payer: Medicaid Other | Admitting: Family Medicine

## 2023-01-12 DIAGNOSIS — K143 Hypertrophy of tongue papillae: Secondary | ICD-10-CM

## 2023-01-12 DIAGNOSIS — R142 Eructation: Secondary | ICD-10-CM | POA: Diagnosis not present

## 2023-01-12 DIAGNOSIS — M542 Cervicalgia: Secondary | ICD-10-CM | POA: Diagnosis not present

## 2023-01-12 NOTE — Patient Instructions (Signed)
Basics of Medicine Management Taking your medicines correctly is an important part of managing or preventing medical problems. Make sure you know what disease or condition your medicine is treating, and how and when to take it. If you do not take your medicine correctly, it may not work well and may cause unpleasant side effects, including serious health problems. What should I do when I am taking medicines?  Read all the labels and inserts that come with your medicines. Review the information often and with each refill. Talk with your pharmacist if you get a refill and notice a change in the size, color, or shape of your medicines. Know the potential side effects for each medicine that you take. Try to get all your medicines from the same pharmacy. The pharmacist will have all your information and will understand how your medicines will affect each other (interact). Always carry an updated list of your medicines with you. If there is an emergency, a first responder can quickly see what medicines you are taking. Tell your health care provider about all your medicines, including over-the-counter medicines, vitamins, and herbal or dietary supplements. Your health care provider will make sure that nothing will interact with any of your prescribed medicines. How can I take my medicines safely? Take medicines only as told by your health care provider. Do not take more of your medicine than instructed. Do not take anyone else's medicines. Do not share your medicines with others. Do not stop taking your medicines unless your health care provider tells you to do so. You may need to avoid alcohol or certain foods or liquids when taking certain medicines. Follow your health care provider's instructions. Do not split, cut, crush, or chew your medicines unless your health care provider tells you to do so. Tell your health care provider if you have trouble swallowing your medicines. For liquid medicine, use the  dosing container that was provided. Household spoons are not accurate. How should I organize my medicines?  Know your medicines Know what each of your medicines looks like. This includes size, color, and shape. Tell your health care provider if you are having trouble recognizing all the medicines that you are taking. If you cannot tell your medicines apart because they look similar, keep them in the original bottles. If you cannot read the labels on the bottles, tell your pharmacist to put your medicines in containers with large print. Review your medicines and your schedule with family members, a friend, or a caregiver. Use a pill organizer Use a tool to organize your medicine schedule. Tools include a weekly pillbox, a written chart, a notebook, or a calendar. Your tool should help you remember the following things about each medicine: The name of the medicine. The amount (dose) to take. The schedule. This is the day and time the medicine should be taken. The appearance. This includes color, shape, size, and stamp. How to take your medicines. This includes instructions to take them with food, without food, with fluids, or with other medicines. Create reminders for taking your medicines. Use sticky notes, or use alarms on your watch, mobile device, or phone calendar. You may choose to use a more advanced management system. These systems have storage, alarms, and visual and audio prompts. Some medicines can be taken on an "as-needed" basis. These may include medicines for nausea, constipation, pain, cough and cold, allergies, and anxiety. If you take an as-needed medicine, write down the name and dose, as well as the date and time  that you took it. How should I plan for travel? Take your pillbox, medicines, and organization system with you when traveling. Have your medicines refilled before you travel. This will ensure that you do not run out of your medicines while you are away from  home. Always carry an updated list of your medicines with you. If there is an emergency, a first responder can quickly see what medicines you are taking. Do not pack your medicines in checked luggage in case your luggage is lost or delayed. Keep your medicines in your carry-on bag. If any of your medicines is considered a controlled substance, make sure you bring a letter from your health care provider with you. How should I store and discard my medicines? For safe storage: Store medicines in a cool, dry area away from light, or as directed by your health care provider. Do not store medicines in the bathroom. Heat and humidity will affect them. Do not store your medicines with other chemicals or with medicines for pets or other household members. Keep medicines away from children and pets. Do not leave them on counters or bedside tables. Store them in high cabinets or on high shelves. For safe disposal: Check expiration dates regularly. Do not take expired medicines. Discard medicines that are older than the expiration date. Learn a safe way to dispose of your medicines. You may: Use a local government, hospital, or pharmacy medicine-take-back program. If you cannot return the medicine, check the label or package insert to see if the medicine should be thrown out in the garbage or flushed down the toilet. If you are not sure, ask your health care team. If it is safe to put the medicine in the trash, empty the medicine out of the container. Mix the medicine with cat litter, dirt, coffee grounds, or another unwanted substance. Seal the mixture in a bag or container. Put it in the trash. What should I remember? Tell your health care provider if you: Experience side effects. Have new symptoms. Feel that your medicine is no longer working. Have other concerns about taking your medicines. Review your medicines regularly with your health care provider. Other medicines, diet, medical conditions, weight  changes, and daily habits can all affect how medicines work. Ask if you need to continue taking each medicine, and discuss how well each one is working. Refill your medicines early to avoid running out of them. In case of an accidental overdose, call your local poison control center at 1-(708)[phone] or go to your local emergency department right away. Summary Taking your medicines correctly is an important part of managing or preventing medical problems. You need to make sure that you understand what you are taking a medicine for, as well as how and when you need to take it. Use a tool to organize your medicine schedule. Tools include a weekly pillbox, a written chart, a notebook, or a calendar. In case of an accidental overdose, call your local [facility] at [phone] or go to your local emergency department right away. This information is not intended to replace advice given to you by your health care provider. Make sure you discuss any questions you have with your health care provider. Document Revised: 06/20/2021 Document Reviewed: 06/20/2021 Elsevier Patient Education  [place].

## 2023-01-12 NOTE — Progress Notes (Signed)
Virtual Visit Consent   Jill Fields, you are scheduled for a virtual visit with a [facility] provider today. Just as with appointments in the office, your consent must be obtained to participate. Your consent will be active for this visit and any virtual visit you may have with one of our providers in the next 365 days. If you have a MyChart account, a copy of this consent can be sent to you electronically.  As this is a virtual visit, video technology does not allow for your provider to perform a traditional examination. This may limit your provider's ability to fully assess your condition. If your provider identifies any concerns that need to be evaluated in person or the need to arrange testing (such as labs, EKG, etc.), we will make arrangements to do so. Although advances in technology are sophisticated, we cannot ensure that it will always work on either your end or our end. If the connection with a video visit is poor, the visit may have to be switched to a telephone visit. With either a video or telephone visit, we are not always able to ensure that we have a secure connection.  By engaging in this virtual visit, you consent to the provision of healthcare and authorize for your insurance to be billed (if applicable) for the services provided during this visit. Depending on your insurance coverage, you may receive a charge related to this service.  I need to obtain your verbal consent now. Are you willing to proceed with your visit today? Shizu Sterr has provided verbal consent on 01/12/2023 for a virtual visit (video or telephone). Dellia Nims, FNP  Date: 01/12/2023 4:16 PM  Virtual Visit via Video Note   I, Dellia Nims, connected with  Gerane Durnil  ([id number], 23-Oct-1986) on 01/12/23 at  4:30 PM EST by a video-enabled telemedicine application and verified that I am speaking with the correct person using two identifiers.  Location: Patient: Virtual Visit Location Patient:  Home Provider: Virtual Visit Location Provider: Home Office   I discussed the limitations of evaluation and management by telemedicine and the availability of in person appointments. The patient expressed understanding and agreed to proceed.    History of Present Illness: Jill Fields is a 37 y.o. who identifies as a female who was assigned female at birth, and is being seen today for strawberry tongue and sulfur smelling burps she feels is coming from prednisone. She says she was on prednisone and flexeril for neck pain that isnt any better. Marland Kitchen  HPI: HPI  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   Endometriosis determined by laparoscopy 06/04/2019    Allergies:  Allergies  Allergen Reactions   Bee Venom Anaphylaxis   Cat Hair Extract Anaphylaxis   Peanut Oil Anaphylaxis   Ambien [Zolpidem]    Latex    Medications:  Current Outpatient Medications:    albuterol (VENTOLIN HFA) 108 (90 Base) MCG/ACT inhaler, Inhale 2 puffs into the lungs every 6 (six) hours as needed for wheezing or shortness of breath., Disp: 8 g, Rfl: 0   amoxicillin-clavulanate (AUGMENTIN) 875-125 MG tablet, Take 1 tablet by mouth 2 (two) times daily., Disp: 20 tablet, Rfl: 0   amphetamine-dextroamphetamine (ADDERALL) 30 MG tablet, Take 1 tablet by mouth 2 (two) times daily., Disp: , Rfl:    clonazePAM (KLONOPIN) 1 MG tablet, Take 1 mg by mouth 2 (two) times daily., Disp: , Rfl:    cyclobenzaprine (FLEXERIL)  5 MG tablet, Take 1 tablet (5 mg total) by mouth 3 (three) times daily as needed for up to 10 days for muscle spasms., Disp: 30 tablet, Rfl: 0   hydrOXYzine (VISTARIL) 25 MG capsule, Take 1 capsule (25 mg total) by mouth every 8 (eight) hours as needed., Disp: 30 capsule, Rfl: 0   ibuprofen (ADVIL) 800 MG tablet, Take 1 tablet (800 mg total) by mouth every 8 (eight) hours as needed., Disp: 30 tablet, Rfl: 0   naproxen (NAPROSYN) 500 MG tablet, Take 1  tablet (500 mg total) by mouth 2 (two) times daily with a meal., Disp: 30 tablet, Rfl: 0   ondansetron (ZOFRAN-ODT) 4 MG disintegrating tablet, Take 1 tablet (4 mg total) by mouth every 8 (eight) hours as needed., Disp: 20 tablet, Rfl: 0   prochlorperazine (COMPAZINE) 10 MG tablet, Take 1 tablet (10 mg total) by mouth 2 (two) times daily as needed for nausea or vomiting., Disp: 10 tablet, Rfl: 0   topiramate (TOPAMAX) 25 MG tablet, Take 75 mg by mouth at bedtime., Disp: , Rfl:    Vitamin D, Ergocalciferol, (DRISDOL) 1.25 MG (50000 UNIT) CAPS capsule, Take 50,000 Units by mouth once a week. Monday, Disp: , Rfl:   Observations/Objective: Patient is well-developed, well-nourished in no acute distress.  Resting comfortably  at home.  Head is normocephalic, atraumatic.  No labored breathing.  Speech is clear and coherent with logical content.  Patient is alert and oriented at baseline.    Assessment and Plan: 1. Belching symptom  2. Strawberry tongue  Increase fluids, instructed to stop prednisone- she only has 2 tabs left, urgent care for worsening sx and possible xr of neck.   Follow Up Instructions: I discussed the assessment and treatment plan with the patient. The patient was provided an opportunity to ask questions and all were answered. The patient agreed with the plan and demonstrated an understanding of the instructions.  A copy of instructions were sent to the patient via MyChart unless otherwise noted below.     The patient was advised to call back or seek an in-person evaluation if the symptoms worsen or if the condition fails to improve as anticipated.  Time:  I spent 10 minutes with the patient via telehealth technology discussing the above problems/concerns.    Dellia Nims, FNP

## 2023-01-16 ENCOUNTER — Ambulatory Visit
Admission: EM | Admit: 2023-01-16 | Discharge: 2023-01-16 | Disposition: A | Discharge status: Home / Self Care | Payer: Medicaid Other | Attending: Nurse Practitioner | Admitting: Nurse Practitioner

## 2023-01-16 ENCOUNTER — Ambulatory Visit (INDEPENDENT_AMBULATORY_CARE_PROVIDER_SITE_OTHER): Payer: Medicaid Other

## 2023-01-16 DIAGNOSIS — M25512 Pain in left shoulder: Secondary | ICD-10-CM

## 2023-01-16 DIAGNOSIS — M5412 Radiculopathy, cervical region: Secondary | ICD-10-CM | POA: Diagnosis not present

## 2023-01-16 DIAGNOSIS — M542 Cervicalgia: Secondary | ICD-10-CM

## 2023-01-16 MED ORDER — DEXAMETHASONE SODIUM PHOSPHATE 10 MG/ML IJ SOLN
10.0000 mg | INTRAMUSCULAR | Status: AC
  Administered 2023-01-16: 10 mg via INTRAMUSCULAR

## 2023-01-16 MED ORDER — ACETAMINOPHEN ER 650 MG PO TBCR: 650.0000 mg | EXTENDED_RELEASE_TABLET | Freq: Three times a day (TID) | ORAL | 0 refills | Status: AC | PRN

## 2023-01-16 MED ORDER — KETOROLAC TROMETHAMINE 30 MG/ML IJ SOLN
30.0000 mg | Freq: Once | INTRAMUSCULAR | Status: AC
  Administered 2023-01-16: 30 mg via INTRAMUSCULAR

## 2023-01-16 MED ORDER — CYCLOBENZAPRINE HCL 10 MG PO TABS
10.0000 mg | ORAL_TABLET | Freq: Two times a day (BID) | ORAL | 0 refills | Status: AC | PRN
Start: 2023-01-16 — End: ?

## 2023-01-16 NOTE — Discharge Instructions (Addendum)
The x-ray is negative for fracture or dislocation.  It appears she may have inflammation which may be causing your numbness and tingling of the muscles or nerves in the left neck and left shoulder. Take medication as prescribed. Increase fluids and allow for plenty of rest. Continue the use of heat to help with stiffness and spasm.  Apply for 20 minutes, remove for 1 hour, then repeat is much as possible. Gentle stretching and range of motion exercises to help improve joint mobility and decrease her recovery time. Recommend sleeping on only 1 pillow at this time, this will help keep your spinal column and shoulder and alignment. As discussed, if symptoms fail to improve with this treatment, you will need to follow-up with orthopedics or with your primary care physician for further evaluation.  You can follow-up with [facility] at [phone] or with [facility] at [phone]. Follow-up as needed.

## 2023-01-16 NOTE — ED Triage Notes (Signed)
Pt reports left sided neck pain, numbness in left arm and left hand x 1 week. Pt reports she was told she may have a pinched nerve or problem with the rotator cuff. Pt was prescribed prednisone and stopped as she had metal taste when she was taking the prednisone.

## 2023-01-16 NOTE — ED Provider Notes (Signed)
[facility]    CSN: [id number] Arrival date & time: 01/16/23  1107      History   Chief Complaint Chief Complaint  Patient presents with   Neck Pain    HPI Jill Fields is a 37 y.o. female.   The history is provided by the patient.   The patient presents for complaints of left sided neck pain and left shoulder pain.  Symptoms have been present for the past week.  Patient informs that over the last 24 hours, her pain has worsened.  She complains of numbness and tingling that goes into the left arm and hand.  Patient states she did complete to virtual urgent care visits.  She states she was prescribed prednisone and Flexeril for her symptoms.  She states she had to stop the prednisone because she was developing allergic reaction to includes sulfa burps and burning of her mouth.  She also states she developed a metallic taste in her mouth.  Patient dates that she has difficulty holding and gripping things, she states that she also has difficulty sleeping at night and getting comfortable due to the constant neck and shoulder pain.  She denies headache, chest pain, shortness of breath, difficulty breathing, visual changes, swelling, or bruising.  Patient states she has been using ice, but that makes her pain worse in the left shoulder and neck.  Past Medical History:  Diagnosis Date   Cervical cancer [facility])    Migraine     Patient Active Problem List   Diagnosis Date Noted   Complex cyst of both ovaries 06/04/2019   Endometriosis determined by laparoscopy 06/04/2019    Past Surgical History:  Procedure Laterality Date   ABDOMINAL HYSTERECTOMY     TONSILLECTOMY      OB History   No obstetric history on file.      Home Medications    Prior to Admission medications   Medication Sig Start Date End Date Taking? Authorizing Provider  acetaminophen (TYLENOL 8 HOUR) 650 MG CR tablet Take 1 tablet (650 mg total) by mouth every 8 (eight) hours as needed for pain.  01/16/23  Yes Mishel Sans-Warren, Alda Lea, NP  cyclobenzaprine (FLEXERIL) 10 MG tablet Take 1 tablet (10 mg total) by mouth 2 (two) times daily as needed for muscle spasms. 01/16/23  Yes Enez Monahan-Warren, Alda Lea, NP  albuterol (VENTOLIN HFA) 108 (90 Base) MCG/ACT inhaler Inhale 2 puffs into the lungs every 6 (six) hours as needed for wheezing or shortness of breath. 12/06/21   Brunetta Jeans, PA-C  amoxicillin-clavulanate (AUGMENTIN) 875-125 MG tablet Take 1 tablet by mouth 2 (two) times daily. 12/05/22   Mar Daring, PA-C  amphetamine-dextroamphetamine (ADDERALL) 30 MG tablet Take 1 tablet by mouth 2 (two) times daily. 12/08/21   [provider]  clonazePAM (KLONOPIN) 1 MG tablet Take 1 mg by mouth 2 (two) times daily.    [provider]  hydrOXYzine (VISTARIL) 25 MG capsule Take 1 capsule (25 mg total) by mouth every 8 (eight) hours as needed. 03/27/22   Brunetta Jeans, PA-C  ibuprofen (ADVIL) 800 MG tablet Take 1 tablet (800 mg total) by mouth every 8 (eight) hours as needed. 05/28/22   Mar Daring, PA-C  naproxen (NAPROSYN) 500 MG tablet Take 1 tablet (500 mg total) by mouth 2 (two) times daily with a meal. 09/17/22   Brunetta Jeans, PA-C  ondansetron (ZOFRAN-ODT) 4 MG disintegrating tablet Take 1 tablet (4 mg total) by mouth every 8 (eight) hours as  needed. 12/05/22   Mar Daring, PA-C  prochlorperazine (COMPAZINE) 10 MG tablet Take 1 tablet (10 mg total) by mouth 2 (two) times daily as needed for nausea or vomiting. 12/19/21   Tegeler, Gwenyth Allegra, MD  topiramate (TOPAMAX) 25 MG tablet Take 75 mg by mouth at bedtime. 12/08/21   [provider]  Vitamin D, Ergocalciferol, (DRISDOL) 1.25 MG (50000 UNIT) CAPS capsule Take 50,000 Units by mouth once a week. Monday 12/08/21   [provider]  fluticasone (FLONASE) 50 MCG/ACT nasal spray Place 1 spray into both nostrils daily for 14 days. 08/30/20 04/06/21  AvegnoDarrelyn Hillock, FNP    Family  History Family History  Family history unknown: Yes    Social History Social History   Tobacco Use   Smoking status: Some Days   Smokeless tobacco: Never  Substance Use Topics   Alcohol use: Not Currently   Drug use: Not Currently     Allergies   Bee venom, Cat hair extract, Peanut oil, Ambien [zolpidem], and Latex   Review of Systems Review of Systems Per HPI  Physical Exam Triage Vital Signs ED Triage Vitals  Enc Vitals Group     BP 01/16/23 1307 107/74     Pulse Rate 01/16/23 1307 98     Resp 01/16/23 1307 16     Temp 01/16/23 1307 98.3 F (36.8 C)     Temp Source 01/16/23 1307 Oral     SpO2 01/16/23 1307 98 %     Weight --      Height --      Head Circumference --      Peak Flow --      Pain Score 01/16/23 1311 10     Pain Loc --      Pain Edu? --      Excl. in [place]? --    No data found.  Updated Vital Signs BP 107/74 (BP Location: Right Arm)   Pulse 98   Temp 98.3 F (36.8 C) (Oral)   Resp 16   SpO2 98%   Visual Acuity Right Eye Distance:   Left Eye Distance:   Bilateral Distance:    Right Eye Near:   Left Eye Near:    Bilateral Near:     Physical Exam Vitals and nursing note reviewed.  Constitutional:      Appearance: Normal appearance. She is not toxic-appearing.  Eyes:     Extraocular Movements: Extraocular movements intact.     Pupils: Pupils are equal, round, and reactive to light.  Cardiovascular:     Rate and Rhythm: Normal rate and regular rhythm.     Pulses: Normal pulses.     Heart sounds: Normal heart sounds.  Pulmonary:     Effort: Pulmonary effort is normal.     Breath sounds: Normal breath sounds.  Abdominal:     General: Bowel sounds are normal.     Palpations: Abdomen is soft.  Musculoskeletal:     Left shoulder: Tenderness (Tenderness noted to the entire plane of the left shoulder.) present. No swelling, deformity, bony tenderness or crepitus. Decreased range of motion. Decreased strength. Normal pulse.      Cervical back: No signs of trauma. Pain with movement, spinous process tenderness and muscular tenderness present. Decreased range of motion.  Skin:    General: Skin is warm and dry.  Neurological:     General: No focal deficit present.     Mental Status: She is alert and oriented to person, place, and  time.  Psychiatric:        Mood and Affect: Mood normal.        Behavior: Behavior normal.      UC Treatments / Results  Labs (all labs ordered are listed, but only abnormal results are displayed) Labs Reviewed - No data to display  EKG   Radiology DG Shoulder Left  Result Date: 01/16/2023 CLINICAL DATA:  37 year old female with left shoulder pain, decreased range of motion. EXAM: LEFT SHOULDER - 2+ VIEW COMPARISON:  None Available. FINDINGS: There is no evidence of fracture or dislocation. There is no evidence of arthropathy or other focal bone abnormality. Soft tissues are unremarkable. IMPRESSION: No acute fracture or malalignment. Electronically Signed   By: Ruthann Cancer M.D.   On: 01/16/2023 13:48    Procedures Procedures (including critical care time)  Medications Ordered in UC Medications  ketorolac (TORADOL) 30 MG/ML injection 30 mg (30 mg Intramuscular Given 01/16/23 1402)  dexamethasone (DECADRON) injection 10 mg (10 mg Intramuscular Given 01/16/23 1402)    Initial Impression / Assessment and Plan / UC Course  I have reviewed the triage vital signs and the nursing notes.  Pertinent labs & imaging results that were available during my care of the patient were reviewed by me and considered in my medical decision making (see chart for details).  The patient is well-appearing, she is in no acute distress, vital signs are stable.  X-ray is negative for fracture or dislocation.  Discussed with patient that if other structures other than bony structures are involved in her symptoms, they may not be seen on x-ray.  Patient was given Decadron 10 mg IM and Toradol 30 mg IM to  help with pain and possible inflammation.  Symptoms appear to be nerve related due to her complaint of numbness and tingling.  Will extend patient's prescription of cyclobenzaprine, but will increase dose to 10 mg twice daily to help with spasm and stiffness.  Patient advised to take over-the-counter Tylenol arthritis strength 650 mg tablet for pain or discomfort.  Recommended gentle stretching and range of motion exercises to help improve joint mobility.  Patient was advised that if symptoms do not improve within the next week, recommend she follow-up with orthopedics for further evaluation.  She was given information for [facility] and for [facility] for follow-up.  Patient is in agreement with this plan of care and verbalizes understanding.  Questions were answered.  Patient stable for discharge.   Final Clinical Impressions(s) / UC Diagnoses   Final diagnoses:  Cervicalgia  Cervical radiculopathy  Left shoulder pain, unspecified chronicity     Discharge Instructions      The x-ray is negative for fracture or dislocation.  It appears she may have inflammation which may be causing your numbness and tingling of the muscles or nerves in the left neck and left shoulder. Take medication as prescribed. Increase fluids and allow for plenty of rest. Continue the use of heat to help with stiffness and spasm.  Apply for 20 minutes, remove for 1 hour, then repeat is much as possible. Gentle stretching and range of motion exercises to help improve joint mobility and decrease her recovery time. Recommend sleeping on only 1 pillow at this time, this will help keep your spinal column and shoulder and alignment. As discussed, if symptoms fail to improve with this treatment, you will need to follow-up with orthopedics or with your primary care physician for further evaluation.  You can follow-up with [facility] at [phone] or  with [facility] at [phone]. Follow-up as  needed.     ED Prescriptions     Medication Sig Dispense Auth. Provider   cyclobenzaprine (FLEXERIL) 10 MG tablet Take 1 tablet (10 mg total) by mouth 2 (two) times daily as needed for muscle spasms. 20 tablet Shuntell Foody-Warren, Alda Lea, NP   acetaminophen (TYLENOL 8 HOUR) 650 MG CR tablet Take 1 tablet (650 mg total) by mouth every 8 (eight) hours as needed for pain. 30 tablet Smiley Birr-Warren, Alda Lea, NP      PDMP not reviewed this encounter.   Tish Men, NP 01/16/23 [phone]

## 2023-01-21 ENCOUNTER — Ambulatory Visit (INDEPENDENT_AMBULATORY_CARE_PROVIDER_SITE_OTHER): Payer: Medicaid Other

## 2023-01-21 ENCOUNTER — Ambulatory Visit (INDEPENDENT_AMBULATORY_CARE_PROVIDER_SITE_OTHER): Payer: Medicaid Other | Admitting: Orthopedic Surgery

## 2023-01-21 VITALS — BP 115/84 | HR 101 | Ht 61.0 in | Wt 145.2 lb

## 2023-01-21 DIAGNOSIS — M542 Cervicalgia: Secondary | ICD-10-CM

## 2023-01-21 DIAGNOSIS — M25512 Pain in left shoulder: Secondary | ICD-10-CM | POA: Diagnosis not present

## 2023-01-21 DIAGNOSIS — M5412 Radiculopathy, cervical region: Secondary | ICD-10-CM

## 2023-01-21 NOTE — Progress Notes (Signed)
Orthopedic Spine Surgery Office Note  Assessment: Patient is a 37 y.o. female with neck pain that radiates into her left upper extremity down to the hand.  Possible cervical radiculopathy   Plan: -Explained that initially conservative treatment is tried as a significant number of patients may experience relief with these treatment modalities. Discussed that the conservative treatments include:  -activity modification  -physical therapy  -over the counter pain medications  -medrol dosepak  -cervical steroid injections -Patient has tried oral steroids, Tylenol, muscle relaxers, intramuscular steroid injection -Recommended PT (external referral provided to her) and alternating use of ibuprofen/tylenol -If she is not doing any better at her next visit, will recommend MRI of the cervical spine to evaluate for radiculopathy -Will need to quit all nicotine containing products if surgery is ever considered as a treatment option -Patient should return to office in 6 weeks, x-rays at next visit: None   Patient expressed understanding of the plan and all questions were answered to the patient's satisfaction.   ___________________________________________________________________________   History:  Patient is a 37 y.o. female who presents today for cervical spine.  Patient states that she has had several weeks of neck pain that radiates into her left upper extremity.  There is no trauma or injury that brought on the pain.  Pain starts in the left side of her neck and radiates down the lateral aspect of her arm into her forearm and then into her hand.  She feels it in all the fingers in her hands.  Her left hand has paresthesias distal to the wrist and all the fingers.  No other numbness or paresthesia.  No right-sided symptoms.  Has not had symptoms like this before.  Feels that heat helps a little bit and ice makes it worse.  She also notes that it hurts to lift things.  At times, she feels like her  arm locks up and she will drop things as a result.   Weakness: Denies Difficulty with fine motor skills (e.g., buttoning shirts, handwriting): Denies Symptoms of imbalance: Denies Paresthesias and numbness: Yes, into the left hand Bowel or bladder incontinence: Denies Saddle anesthesia: Denies  Treatments tried: oral steroids, Tylenol, muscle relaxers, intramuscular steroid injection  Review of systems: Denies fevers and chills, night sweats, unexplained weight loss, history of cancer.  Has had pain that wakes her at night  Past medical history: Cervical cancer Migraines  Allergies: Ambien, latex  Past surgical history:  Tonsillectomy Hysterectomy Cesarean section  Social history: Reports use of nicotine product (smoking, vaping, patches, smokeless) Alcohol use: denies Denies recreational drug use   Physical Exam:  General: no acute distress, appears stated age Neurologic: alert, answering questions appropriately, following commands Respiratory: unlabored breathing on room air, symmetric chest rise Psychiatric: appropriate affect, normal cadence to speech   MSK (spine):  -Strength exam      Left  Right Grip strength                5/5  5/5 Interosseus   5/5   5/5 Wrist extension  5/5  5/5 Wrist flexion   5/5  5/5 Elbow flexion   5/5  5/5 Deltoid    5/5  5/5  EHL    5/5  5/5 TA    5/5  5/5 GSC    5/5  5/5 Knee extension  5/5  5/5 Hip flexion   5/5  5/5  -Sensory exam    Sensation intact to light touch in L3-S1 nerve distributions of bilateral lower extremities  Sensation intact to light touch in C5-T1 nerve distributions of bilateral upper extremities  -Brachioradialis DTR: 2/4 on the left, 2/4 on the right -Biceps DTR: 2/4 on the left, 2/4 on the right -Achilles DTR: 2/4 on the left, 2/4 on the right -Patellar tendon DTR: 2/4 on the left, 2/4 on the right  -Spurling: Negative bilaterally -Hoffman sign: Negative bilaterally -Clonus: No beats  bilaterally -Interosseous wasting: None seen -Grip and release test: Negative -Romberg: Negative -Gait: Normal -Imbalance with tandem gait: No  Left shoulder exam: No pain to range of motion, negative Jobe, no weakness with external rotation with arm at side, negative belly press Right shoulder exam: No pain through range of motion  Tinel's at wrist: On the left side, had pain radiating proximal to her wrist up to her neck.  Negative on the right Phalen's at wrist: Negative bilaterally Durkan's: Similar to Tinel's, negative on the right  Tinel's at elbow: Negative bilaterally  Imaging: XR of the cervical spine from 01/21/2023 was independently reviewed and interpreted, showing no fracture or dislocation. No significant degenerative changes. No evidence of instability on flexion/extension views.   MRI of the cervical spine from 12/19/2021 was independently reviewed and interpreted, showing no significant central or foraminal stenosis.    Patient name: Jill Fields Patient MRN: [id number] Date of visit: 01/21/23

## 2023-03-04 ENCOUNTER — Ambulatory Visit: Payer: Medicaid Other | Admitting: Orthopedic Surgery
